# Patient Record
Sex: Female | Born: 1995 | Race: White | Hispanic: No | Marital: Single | Smoking: Never smoker
Health system: Southern US, Community
[De-identification: ages and names within clinical notes are randomized; demographics above are authoritative.]

## PROBLEM LIST (undated history)

## (undated) DIAGNOSIS — F32A Depression, unspecified: Secondary | ICD-10-CM

## (undated) DIAGNOSIS — Z860100 Personal history of colon polyps, unspecified: Secondary | ICD-10-CM

## (undated) DIAGNOSIS — Z8719 Personal history of other diseases of the digestive system: Secondary | ICD-10-CM

## (undated) DIAGNOSIS — F329 Major depressive disorder, single episode, unspecified: Secondary | ICD-10-CM

## (undated) DIAGNOSIS — Z8679 Personal history of other diseases of the circulatory system: Secondary | ICD-10-CM

## (undated) DIAGNOSIS — F419 Anxiety disorder, unspecified: Secondary | ICD-10-CM

## (undated) DIAGNOSIS — Z87440 Personal history of urinary (tract) infections: Secondary | ICD-10-CM

## (undated) HISTORY — DX: Personal history of urinary (tract) infections: Z87.440

## (undated) HISTORY — DX: Personal history of colon polyps, unspecified: Z86.0100

## (undated) HISTORY — DX: Personal history of other diseases of the circulatory system: Z86.79

## (undated) HISTORY — DX: Personal history of other diseases of the digestive system: Z87.19

## (undated) HISTORY — DX: Anxiety disorder, unspecified: F41.9

## (undated) HISTORY — DX: Depression, unspecified: F32.A

## (undated) HISTORY — DX: Major depressive disorder, single episode, unspecified: F32.9

---

## 2018-05-07 ENCOUNTER — Telehealth: Payer: Self-pay | Admitting: *Deleted

## 2018-05-07 NOTE — Telephone Encounter (Signed)
Copied from CRM [id number]. Topic: Quick Communication - Appointment Cancellation >> May 07, 2018  1:04 PM Mickel BaasMcGee, Demi B, [place] wrote: Patient called to cancel appointment scheduled for 05/08/18. Patient has not rescheduled their appointment.  Route to department's PEC pool.

## 2018-05-08 ENCOUNTER — Ambulatory Visit: Payer: Self-pay | Admitting: Internal Medicine

## 2018-08-01 ENCOUNTER — Encounter: Payer: Self-pay | Admitting: Family Medicine

## 2018-08-01 ENCOUNTER — Ambulatory Visit (INDEPENDENT_AMBULATORY_CARE_PROVIDER_SITE_OTHER): Payer: BLUE CROSS/BLUE SHIELD | Admitting: Family Medicine

## 2018-08-01 VITALS — BP 138/98 | HR 76 | Temp 98.6°F | Ht 61.5 in | Wt 197.8 lb

## 2018-08-01 DIAGNOSIS — E6609 Other obesity due to excess calories: Secondary | ICD-10-CM | POA: Diagnosis not present

## 2018-08-01 DIAGNOSIS — F419 Anxiety disorder, unspecified: Secondary | ICD-10-CM | POA: Diagnosis not present

## 2018-08-01 DIAGNOSIS — R030 Elevated blood-pressure reading, without diagnosis of hypertension: Secondary | ICD-10-CM | POA: Diagnosis not present

## 2018-08-01 DIAGNOSIS — F329 Major depressive disorder, single episode, unspecified: Secondary | ICD-10-CM | POA: Diagnosis not present

## 2018-08-01 DIAGNOSIS — Z6836 Body mass index (BMI) 36.0-36.9, adult: Secondary | ICD-10-CM

## 2018-08-01 DIAGNOSIS — F32A Depression, unspecified: Secondary | ICD-10-CM

## 2018-08-01 MED ORDER — HYDROXYZINE HCL 10 MG PO TABS: 10.0000 mg | ORAL_TABLET | Freq: Three times a day (TID) | ORAL | 1 refills | Status: DC | PRN

## 2018-08-01 MED ORDER — ESCITALOPRAM OXALATE 10 MG PO TABS: 10.0000 mg | ORAL_TABLET | Freq: Every day | ORAL | 2 refills | Status: DC

## 2018-08-01 NOTE — Patient Instructions (Signed)
This is  Dr. Tullo's  example of a  "Low GI"  Diet:  It will allow you to lose 4 to 8  lbs  per month if you follow it carefully.  Your goal with exercise is a minimum of 30 minutes of aerobic exercise 5 days per week (Walking does not count once it becomes easy!)    All of the foods can be found at grocery stores and in bulk at [employer].  The Atkins protein bars and shakes are available in more varieties at Target, Wal[employer] and [employer]'s [employer].     7 AM Breakfast:  Choose from the following:  Low carbohydrate Protein  Shakes (I recommend the  Premier Protein chocolate shakes,  EAS AdvantEdge "Carb Control" shakes  Or the Atkins shakes all are under 3 net carbs)     a scrambled egg/bacon/cheese burrito made with Mission's "carb balance" whole wheat tortilla  (about 10 net carbs )  [employer] sells microwaveable frittata (basically a quiche without the pastry crust) that is eaten cold and very convenient way to get your eggs.  8 carbs)  If you make your own protein shakes, avoid bananas and pineapple,  And use low carb greek yogurt or original /unsweetened almond or soy milk    Avoid cereal and bananas, oatmeal and cream of wheat and grits. They are loaded with carbohydrates!   10 AM: high protein snack:  Protein bar by Atkins (the snack size, under 200 cal, usually < 6 net carbs).    A stick of cheese:  Around 1 carb,  100 cal     Dannon Light n Fit Greek Yogurt  (80 cal, 8 carbs)  Other so called "protein bars" and Greek yogurts tend to be loaded with carbohydrates.  Remember, in food advertising, the word "energy" is synonymous for " carbohydrate."  Lunch:   A Sandwich using the bread choices listed, Can use any  Eggs,  lunchmeat, grilled meat or canned tuna), avocado, regular mayo/mustard  and cheese.  A Salad using blue cheese, ranch,  Goddess or vinagrette,  Avoid taco shells, croutons or "confetti" and no "candied nuts" but regular nuts OK.   No pretzels, nabs  or chips.  Pickles and  miniature sweet peppers are a good low carb alternative that provide a "crunch"  The bread is the only source of carbohydrate in a sandwich and  can be decreased by trying some of the attached alternatives to traditional loaf bread   Avoid "Low fat dressings, as well as [place] and [place] dressings They are loaded with sugar!   3 PM/ Mid day  Snack:  Consider  1 ounce of  almonds, walnuts, pistachios, pecans, peanuts,  Macadamia nuts or a nut medley.  Avoid "granola and granola bars "  Mixed nuts are ok in moderation as long as there are no raisins,  cranberries or dried fruit.   KIND bars are OK if you get the low glycemic index variety   Try the prosciutto/mozzarella cheese sticks by Fiorruci  In deli /backery section   High protein      6 PM  Dinner:     Meat/fowl/fish with a green salad, and either broccoli, cauliflower, green beans, spinach, brussel sprouts or  Lima beans. DO NOT BREAD THE PROTEIN!!      There is a low carb pasta by Dreamfield's that is acceptable and tastes great: only 5 digestible carbs/serving.( All grocery stores but BJs carry it ) Several ready made meals are   available low carb:   Try Michel Angelo's chicken piccata or chicken or eggplant parm over low carb pasta.(Lowes and BJs)   Aaron Sanchez's "Carnitas" (pulled pork, no sauce,  0 carbs) or his beef pot roast to make a dinner burrito (at [employer]J's)  Pesto over low carb pasta (bj's sells a good quality pesto in the center refrigerated section of the deli   Try satueeing  Bok Choy with mushroooms as a good side   Green Giant makes a mashed cauliflower that tastes like mashed potatoes  Whole wheat pasta is still full of digestible carbs and  Not as low in glycemic index as Dreamfield's.   Brown rice is still rice,  So skip the rice and noodles if you eat Chinese or Thai (or at least limit to 1/2 cup)  9 PM snack :   Breyer's "low carb" fudgsicle or  ice cream bar (Carb Smart line), or  Weight Watcher's ice  cream bar , or another "no sugar added" ice cream;  a serving of fresh berries/cherries with whipped cream   Cheese or DANNON'S LlGHT N FIT GREEK YOGURT  8 ounces of [place] unsweetened almond/cococunut milk    Treat yourself to a parfait made with whipped cream blueberiies, walnuts and vanilla greek yogurt  Avoid bananas, pineapple, grapes  and watermelon on a regular basis because they are high in sugar.  THINK OF THEM AS DESSERT  Remember that snack Substitutions should be less than 10 NET carbs per serving and meals < 20 carbs. Remember to subtract fiber grams to get the "net carbs."   

## 2018-08-01 NOTE — Progress Notes (Signed)
Subjective:    Patient ID: Leah Baxter, female    DOB: 1995-12-22, 22 y.o.   MRN: [id number]  HPI   Patient presents to clinic to establish primary care.  Her main concern today is anxiety and depression.  States her anxiety has seemed worse over the past few years, especially after the passing of her sister.  Patient states she will get anxious for no reason, especially when having to go out on group events and going to family gatherings.  Denies any suicidal homicidal ideation.  Does have times where she feels down and sad, but states that anxiety is the more pressing issue.  Patient also would like to discuss her weight.  Patient states she often turns to food as comfort due to feeling anxious or sad.  States she has never really tried any sort of diet and stuck to it, she will have periods where she will work on eating healthy and exercising for a few weeks, but gets discouraged that she is not seeing results quickly.  Patient states she has gone to weight loss clinic in the past and was started on phentermine, but felt super anxious on this medicine.  Past Medical History:  Diagnosis Date  . Anxiety   . Depression    Family History  Problem Relation Age of Onset  . Hyperlipidemia Mother   . Hypertension Mother    Social History   Tobacco Use  . Smoking status: Never Smoker  . Smokeless tobacco: Never Used  Substance Use Topics  . Alcohol use: Yes   History reviewed. No pertinent surgical history.  Review of Systems  Constitutional: Negative for chills, fatigue and fever.  HENT: Negative for congestion, ear pain, sinus pain and sore throat.   Eyes: Negative.   Respiratory: Negative for cough, shortness of breath and wheezing.   Cardiovascular: Negative for chest pain, palpitations and leg swelling.  Gastrointestinal: Negative for abdominal pain, diarrhea, nausea and vomiting.  Genitourinary: Negative for dysuria, frequency and urgency.  Musculoskeletal: Negative for  arthralgias and myalgias.  Skin: Negative for color change, pallor and rash.  Neurological: Negative for syncope, light-headedness and headaches.  Psychiatric/Behavioral: The patient is nervous/anxious.       Objective:   Physical Exam  Constitutional: She is oriented to person, place, and time. No distress.  HENT:  Head: Normocephalic and atraumatic.  Eyes: Conjunctivae and EOM are normal. No scleral icterus.  Neck: Neck supple. No tracheal deviation present.  Cardiovascular: Normal rate and regular rhythm.  Pulmonary/Chest: Effort normal and breath sounds normal.  Abdominal: Soft. Bowel sounds are normal. There is no tenderness.  Musculoskeletal: Normal range of motion. She exhibits no edema.  Neurological: She is alert and oriented to person, place, and time.  Skin: Skin is warm and dry. She is not diaphoretic. No pallor.  Psychiatric: She has a normal mood and affect. Her behavior is normal. Judgment and thought content normal.  States anxiety will come up out of nowhere at times and she will end up staying at home due to being too anxious to go out  Nursing note and vitals reviewed.    Body mass index is 36.77 kg/m.  Vitals:   08/01/18 1552  BP: (!) 138/98  Pulse: 76  Temp: 98.6 F (37 C)  SpO2: 96%   Assessment & Plan:   Anxiety and depression-patient will take Lexapro 10 mg once daily.  She will also use hydroxyzine as needed for any anxiety breakthrough.  Strategies to help control anxiety discussed  including deep breathing, going for walks.  Patient would prefer not to go to counseling at this time, states it does not work with her work schedule.  Obesity-patient given low-carb diet plan to work on following and also advised to download the app called lose it which is a great applicable on her smart phone that helps to count calories and keep track of all of your intake.  Patient encouraged to do regular physical activity at least 3-5 times per week.  Also advised that  weight loss is a long journey, it does not happen quickly.  Also discussed that we want her to change her diet and await that will be maintainable for the long-term rather than an extreme diet that is impossible to maintain.  Elevated BP reading-patient blood pressure elevated in clinic today.  We will monitor blood pressure readings at her next visit.  Follow-up in 3 to 4 weeks for recheck after starting new medication and also working on diet and exercise.  Advised she can return to clinic sooner if any issues arise.

## 2018-08-02 ENCOUNTER — Encounter: Payer: Self-pay | Admitting: Family Medicine

## 2018-08-02 DIAGNOSIS — Z6836 Body mass index (BMI) 36.0-36.9, adult: Secondary | ICD-10-CM

## 2018-08-02 DIAGNOSIS — F329 Major depressive disorder, single episode, unspecified: Secondary | ICD-10-CM | POA: Insufficient documentation

## 2018-08-02 DIAGNOSIS — E6609 Other obesity due to excess calories: Secondary | ICD-10-CM | POA: Insufficient documentation

## 2018-08-02 DIAGNOSIS — F419 Anxiety disorder, unspecified: Principal | ICD-10-CM

## 2018-08-02 DIAGNOSIS — F32A Depression, unspecified: Secondary | ICD-10-CM | POA: Insufficient documentation

## 2018-09-02 ENCOUNTER — Ambulatory Visit: Payer: BLUE CROSS/BLUE SHIELD | Admitting: Family Medicine

## 2018-11-21 ENCOUNTER — Ambulatory Visit (INDEPENDENT_AMBULATORY_CARE_PROVIDER_SITE_OTHER): Payer: Managed Care, Other (non HMO) | Admitting: Internal Medicine

## 2018-11-21 ENCOUNTER — Encounter: Payer: Self-pay | Admitting: Internal Medicine

## 2018-11-21 VITALS — BP 138/90 | HR 84 | Temp 98.5°F | Ht 61.5 in | Wt 200.2 lb

## 2018-11-21 DIAGNOSIS — M62838 Other muscle spasm: Secondary | ICD-10-CM

## 2018-11-21 DIAGNOSIS — M542 Cervicalgia: Secondary | ICD-10-CM

## 2018-11-21 MED ORDER — METHOCARBAMOL 500 MG PO TABS: 500.0000 mg | ORAL_TABLET | Freq: Two times a day (BID) | ORAL | 0 refills | Status: DC | PRN

## 2018-11-21 NOTE — Patient Instructions (Signed)
Look up neck exercises on [facility] or web MD  Try tylenol with ibuprofen as needed ibuprofen x 2 weeks or less  Heat   Cervical Sprain  A cervical sprain is a stretch or tear in one or more of the tough, cord-like tissues that connect bones (ligaments) in the neck. Cervical sprains can range from mild to severe. Severe cervical sprains can cause the spinal bones (vertebrae) in the neck to be unstable. This can lead to spinal cord damage and can result in serious nervous system problems. The amount of time that it takes for a cervical sprain to get better depends on the cause and extent of the injury. Most cervical sprains heal in 4-6 weeks. What are the causes? Cervical sprains may be caused by an injury (trauma), such as from a motor vehicle accident, a fall, or sudden forward and backward whipping movement of the head and neck (whiplash injury). Mild cervical sprains may be caused by wear and tear over time, such as from poor posture, sitting in a chair that does not provide support, or looking up or down for long periods of time. What increases the risk? The following factors may make you more likely to develop this condition:  Participating in activities that have a high risk of trauma to the neck. These include contact sports, auto racing, gymnastics, and diving.  Taking risks when driving or riding in a motor vehicle, such as speeding.  Having osteoarthritis of the spine.  Having poor strength and flexibility of the neck.  A previous neck injury.  Having poor posture.  Spending a lot of time in certain positions that put stress on the neck, such as sitting at a computer for long periods of time. What are the signs or symptoms? Symptoms of this condition include:  Pain, soreness, stiffness, tenderness, swelling, or a burning sensation in the front, back, or sides of the neck.  Sudden tightening of neck muscles that you cannot control (muscle spasms).  Pain in the shoulders  or upper back.  Limited ability to move the neck.  Headache.  Dizziness.  Nausea.  Vomiting.  Weakness, numbness, or tingling in a hand or an arm. Symptoms may develop right away after injury, or they may develop over a few days. In some cases, symptoms may go away with treatment and return (recur) over time. How is this diagnosed? This condition may be diagnosed based on:  Your medical history.  Your symptoms.  Any recent injuries or known neck problems that you have, such as arthritis in the neck.  A physical exam.  Imaging tests, such as: ? X-rays. ? MRI. ? CT scan. How is this treated? This condition is treated by resting and icing the injured area and doing physical therapy exercises. Depending on the severity of your condition, treatment may also include:  Keeping your neck in place (immobilized) for periods of time. This may be done using: ? A cervical collar. This supports your chin and the back of your head. ? A cervical traction device. This is a sling that holds up your head. This removes weight and pressure from your neck, and it may help to relieve pain.  Medicines that help to relieve pain and inflammation.  Medicines that help to relax your muscles (muscle relaxants).  Surgery. This is rare. Follow these instructions at home: If you have a cervical collar:   Wear it as told by your health care provider. Do not remove the collar unless instructed by your health  care provider.  Ask your health care provider before you make any adjustments to your collar.  If you have long hair, keep it outside of the collar.  Ask your health care provider if you can remove the collar for cleaning and bathing. If you are allowed to remove the collar for cleaning or bathing: ? Follow instructions from your health care provider about how to remove the collar safely. ? Clean the collar by wiping it with mild soap and water and drying it completely. ? If your collar has  removable pads, remove them every 1-2 days and wash them by hand with soap and water. Let them air-dry completely before you put them back in the collar. ? Check your skin under the collar for irritation or sores. If you see any, tell your health care provider. Managing pain, stiffness, and swelling   If directed, use a cervical traction device as told by your health care provider.  If directed, apply heat to the affected area before you do your physical therapy or as often as told by your health care provider. Use the heat source that your health care provider recommends, such as a moist heat pack or a heating pad. ? Place a towel between your skin and the heat source. ? Leave the heat on for 20-30 minutes. ? Remove the heat if your skin turns bright red. This is especially important if you are unable to feel pain, heat, or cold. You may have a greater risk of getting burned.  If directed, put ice on the affected area: ? Put ice in a plastic bag. ? Place a towel between your skin and the bag. ? Leave the ice on for 20 minutes, 2-3 times a day. Activity  Do not drive while wearing a cervical collar. If you do not have a cervical collar, ask your health care provider if it is safe to drive while your neck heals.  Do not drive or use heavy machinery while taking prescription pain medicine or muscle relaxants, unless your health care provider approves.  Do not lift anything that is heavier than 10 lb (4.5 kg) until your health care provider tells you that it is safe.  Rest as directed by your health care provider. Avoid positions and activities that make your symptoms worse. Ask your health care provider what activities are safe for you.  If physical therapy was prescribed, do exercises as told by your health care provider or physical therapist. General instructions  Take over-the-counter and prescription medicines only as told by your health care provider.  Do not use any products that  contain nicotine or tobacco, such as cigarettes and e-cigarettes. These can delay healing. If you need help quitting, ask your health care provider.  Keep all follow-up visits as told by your health care provider or physical therapist. This is important. How is this prevented? To prevent a cervical sprain from happening again:  Use and maintain good posture. Make any needed adjustments to your workstation to help you use good posture.  Exercise regularly as directed by your health care provider or physical therapist.  Avoid risky activities that may cause a cervical sprain. Contact a health care provider if:  You have symptoms that get worse or do not get better after 2 weeks of treatment.  You have pain that gets worse or does not get better with medicine.  You develop new, unexplained symptoms.  You have sores or irritated skin on your neck from wearing your cervical collar.  Get help right away if:  You have severe pain.  You develop numbness, tingling, or weakness in any part of your body.  You cannot move a part of your body (you have paralysis).  You have neck pain along with: ? Severe dizziness. ? Headache. Summary  A cervical sprain is a stretch or tear in one or more of the tough, cord-like tissues that connect bones (ligaments) in the neck.  Cervical sprains may be caused by an injury (trauma), such as from a motor vehicle accident, a fall, or sudden forward and backward whipping movement of the head and neck (whiplash injury).  Symptoms may develop right away after injury, or they may develop over a few days.  This condition is treated by resting and icing the injured area and doing physical therapy exercises. This information is not intended to replace advice given to you by your health care provider. Make sure you discuss any questions you have with your health care provider. Document Released: 07/16/2007 Document Revised: 05/17/2016 Document Reviewed:  05/17/2016 Elsevier Interactive Patient Education  2019 [employer].

## 2018-11-21 NOTE — Progress Notes (Signed)
Pre visit review using our clinic review tool, if applicable. No additional management support is needed unless otherwise documented below in the visit note. 

## 2018-11-21 NOTE — Progress Notes (Signed)
Chief Complaint  Patient presents with  . Follow-up   Acute visit neck pain since 2/18 She flipped her hair over her shoulder Tuesday am and had 10/10 neck pain x 1.5 days now neck pain 6/10 and trouble with ROM esp to the right. She did not sleep wrong nor denies neck trauma. She went to [facility] x2 times on Tuesday and has had another appt and 1 today total 4 office visits she is trying heating pad and stretching which helps some and Tylenol.    Review of Systems  Constitutional: Negative for weight loss.  HENT: Negative for hearing loss.   Eyes: Negative for blurred vision.  Respiratory: Negative for shortness of breath.   Cardiovascular: Negative for chest pain.  Gastrointestinal: Negative for abdominal pain.  Musculoskeletal: Positive for neck pain.  Skin: Negative for rash.  Neurological: Negative for headaches.  Psychiatric/Behavioral: Negative for depression.   Past Medical History:  Diagnosis Date  . Anxiety   . Depression    No past surgical history on file. Family History  Problem Relation Age of Onset  . Hyperlipidemia Mother   . Hypertension Mother    Social History   Socioeconomic History  . Marital status: Single    Spouse name: Not on file  . Number of children: Not on file  . Years of education: Not on file  . Highest education level: Not on file  Occupational History  . Not on file  Social Needs  . Financial resource strain: Not on file  . Food insecurity:    Worry: Not on file    Inability: Not on file  . Transportation needs:    Medical: Not on file    Non-medical: Not on file  Tobacco Use  . Smoking status: Never Smoker  . Smokeless tobacco: Never Used  Substance and Sexual Activity  . Alcohol use: Yes  . Drug use: Never  . Sexual activity: Not on file  Lifestyle  . Physical activity:    Days per week: Not on file    Minutes per session: Not on file  . Stress: Not on file  Relationships  . Social connections:    Talks on  phone: Not on file    Gets together: Not on file    Attends religious service: Not on file    Active member of club or organization: Not on file    Attends meetings of clubs or organizations: Not on file    Relationship status: Not on file  . Intimate partner violence:    Fear of current or ex partner: Not on file    Emotionally abused: Not on file    Physically abused: Not on file    Forced sexual activity: Not on file  Other Topics Concern  . Not on file  Social History Narrative  . Not on file   Current Meds  Medication Sig  . escitalopram (LEXAPRO) 10 MG tablet Take 1 tablet (10 mg total) by mouth daily.  . hydrOXYzine (ATARAX/VISTARIL) 10 MG tablet Take 1 tablet (10 mg total) by mouth 3 (three) times daily as needed for anxiety.   Allergies  Allergen Reactions  . Penicillins Hives   No results found for this or any previous visit (from the past 2160 hour(s)). Objective  Body mass index is 37.21 kg/m. Wt Readings from Last 3 Encounters:  11/21/18 200 lb 3.2 oz (90.8 kg)  08/01/18 197 lb 12.8 oz (89.7 kg)   Temp Readings from Last 3 Encounters:  11/21/18  98.5 F (36.9 C) (Oral)  08/01/18 98.6 F (37 C) (Oral)   BP Readings from Last 3 Encounters:  11/21/18 138/90  08/01/18 (!) 138/98   Pulse Readings from Last 3 Encounters:  11/21/18 84  08/01/18 76    Physical Exam Vitals signs and nursing note reviewed.  Constitutional:      Appearance: Normal appearance. She is well-developed and well-groomed.  HENT:     Head: Normocephalic and atraumatic.     Nose: Nose normal.     Mouth/Throat:     Mouth: Mucous membranes are moist.     Pharynx: Oropharynx is clear.  Eyes:     Conjunctiva/sclera: Conjunctivae normal.     Pupils: Pupils are equal, round, and reactive to light.  Cardiovascular:     Rate and Rhythm: Normal rate and regular rhythm.     Heart sounds: Normal heart sounds.  Pulmonary:     Effort: Pulmonary effort is normal.     Breath sounds: Normal  breath sounds.  Musculoskeletal:     Right shoulder: She exhibits spasm.     Cervical back: She exhibits decreased range of motion, tenderness and spasm.  Skin:    General: Skin is warm and dry.  Neurological:     General: No focal deficit present.     Mental Status: She is alert and oriented to person, place, and time. Mental status is at baseline.     Gait: Gait normal.  Psychiatric:        Attention and Perception: Attention and perception normal.        Mood and Affect: Mood and affect normal.        Speech: Speech normal.        Behavior: Behavior normal. Behavior is cooperative.        Thought Content: Thought content normal.        Cognition and Memory: Cognition and memory normal.        Judgment: Judgment normal.     Assessment   1. Cervicalgia and muscle spasm Plan   1. rec exercises f/u with chiropractor  Heat, tylenol, nsaids Aspercream, bengay or tiger balm  If not better f/u PCP in 1 month  Robaxin 500 mg bid try at night 1st only    Provider: Dr. French Ana McLean-Scocuzza-[facility]

## 2019-06-11 ENCOUNTER — Ambulatory Visit (INDEPENDENT_AMBULATORY_CARE_PROVIDER_SITE_OTHER): Payer: Managed Care, Other (non HMO) | Admitting: Family Medicine

## 2019-06-11 ENCOUNTER — Ambulatory Visit: Payer: Managed Care, Other (non HMO) | Admitting: Family Medicine

## 2019-06-11 ENCOUNTER — Encounter: Payer: Self-pay | Admitting: Family Medicine

## 2019-06-11 ENCOUNTER — Other Ambulatory Visit: Payer: Self-pay

## 2019-06-11 VITALS — BP 118/90 | HR 96 | Temp 97.5°F | Resp 16 | Ht 61.0 in | Wt 202.0 lb

## 2019-06-11 DIAGNOSIS — F419 Anxiety disorder, unspecified: Secondary | ICD-10-CM

## 2019-06-11 DIAGNOSIS — F32A Depression, unspecified: Secondary | ICD-10-CM

## 2019-06-11 DIAGNOSIS — F329 Major depressive disorder, single episode, unspecified: Secondary | ICD-10-CM

## 2019-06-11 DIAGNOSIS — Z713 Dietary counseling and surveillance: Secondary | ICD-10-CM | POA: Diagnosis not present

## 2019-06-11 DIAGNOSIS — G4700 Insomnia, unspecified: Secondary | ICD-10-CM | POA: Diagnosis not present

## 2019-06-11 DIAGNOSIS — Z6838 Body mass index (BMI) 38.0-38.9, adult: Secondary | ICD-10-CM | POA: Diagnosis not present

## 2019-06-11 MED ORDER — TRAZODONE HCL 50 MG PO TABS: 25.0000 mg | ORAL_TABLET | Freq: Every evening | ORAL | 2 refills | Status: DC | PRN

## 2019-06-11 MED ORDER — BUPROPION HCL ER (XL) 150 MG PO TB24: 150.0000 mg | ORAL_TABLET | Freq: Every day | ORAL | 2 refills | Status: DC

## 2019-06-11 NOTE — Patient Instructions (Signed)
This is  Dr. Tullo's  example of a  "Low GI"  Diet:  It will allow you to lose 4 to 8  lbs  per month if you follow it carefully.  Your goal with exercise is a minimum of 30 minutes of aerobic exercise 5 days per week (Walking does not count once it becomes easy!)    All of the foods can be found at grocery stores and in bulk at [employer].  The Atkins protein bars and shakes are available in more varieties at Target, Wal[employer] and [employer]'s [employer].     7 AM Breakfast:  Choose from the following:  Low carbohydrate Protein  Shakes (I recommend the  Premier Protein chocolate shakes,  EAS AdvantEdge "Carb Control" shakes  Or the Atkins shakes all are under 3 net carbs)     a scrambled egg/bacon/cheese burrito made with Mission's "carb balance" whole wheat tortilla  (about 10 net carbs )  [employer] sells microwaveable frittata (basically a quiche without the pastry crust) that is eaten cold and very convenient way to get your eggs.  8 carbs)  If you make your own protein shakes, avoid bananas and pineapple,  And use low carb greek yogurt or original /unsweetened almond or soy milk    Avoid cereal and bananas, oatmeal and cream of wheat and grits. They are loaded with carbohydrates!   10 AM: high protein snack:  Protein bar by Atkins (the snack size, under 200 cal, usually < 6 net carbs).    A stick of cheese:  Around 1 carb,  100 cal     Dannon Light n Fit Greek Yogurt  (80 cal, 8 carbs)  Other so called "protein bars" and Greek yogurts tend to be loaded with carbohydrates.  Remember, in food advertising, the word "energy" is synonymous for " carbohydrate."  Lunch:   A Sandwich using the bread choices listed, Can use any  Eggs,  lunchmeat, grilled meat or canned tuna), avocado, regular mayo/mustard  and cheese.  A Salad using blue cheese, ranch,  Goddess or vinagrette,  Avoid taco shells, croutons or "confetti" and no "candied nuts" but regular nuts OK.   No pretzels, nabs  or chips.  Pickles and  miniature sweet peppers are a good low carb alternative that provide a "crunch"  The bread is the only source of carbohydrate in a sandwich and  can be decreased by trying some of the attached alternatives to traditional loaf bread   Avoid "Low fat dressings, as well as [place] and [place] dressings They are loaded with sugar!   3 PM/ Mid day  Snack:  Consider  1 ounce of  almonds, walnuts, pistachios, pecans, peanuts,  Macadamia nuts or a nut medley.  Avoid "granola and granola bars "  Mixed nuts are ok in moderation as long as there are no raisins,  cranberries or dried fruit.   KIND bars are OK if you get the low glycemic index variety   Try the prosciutto/mozzarella cheese sticks by Fiorruci  In deli /backery section   High protein      6 PM  Dinner:     Meat/fowl/fish with a green salad, and either broccoli, cauliflower, green beans, spinach, brussel sprouts or  Lima beans. DO NOT BREAD THE PROTEIN!!      There is a low carb pasta by Dreamfield's that is acceptable and tastes great: only 5 digestible carbs/serving.( All grocery stores but BJs carry it ) Several ready made meals are   available low carb:   Try Michel Angelo's chicken piccata or chicken or eggplant parm over low carb pasta.(Lowes and BJs)   Aaron Sanchez's "Carnitas" (pulled pork, no sauce,  0 carbs) or his beef pot roast to make a dinner burrito (at [employer]J's)  Pesto over low carb pasta (bj's sells a good quality pesto in the center refrigerated section of the deli   Try satueeing  Bok Choy with mushroooms as a good side   Green Giant makes a mashed cauliflower that tastes like mashed potatoes  Whole wheat pasta is still full of digestible carbs and  Not as low in glycemic index as Dreamfield's.   Brown rice is still rice,  So skip the rice and noodles if you eat Chinese or Thai (or at least limit to 1/2 cup)  9 PM snack :   Breyer's "low carb" fudgsicle or  ice cream bar (Carb Smart line), or  Weight Watcher's ice  cream bar , or another "no sugar added" ice cream;  a serving of fresh berries/cherries with whipped cream   Cheese or DANNON'S LlGHT N FIT GREEK YOGURT  8 ounces of [place] unsweetened almond/cococunut milk    Treat yourself to a parfait made with whipped cream blueberiies, walnuts and vanilla greek yogurt  Avoid bananas, pineapple, grapes  and watermelon on a regular basis because they are high in sugar.  THINK OF THEM AS DESSERT  Remember that snack Substitutions should be less than 10 NET carbs per serving and meals < 20 carbs. Remember to subtract fiber grams to get the "net carbs."   

## 2019-06-11 NOTE — Progress Notes (Signed)
Subjective:    Patient ID: Leah Baxter, female    DOB: 02-02-1996, 23 y.o.   MRN: [id number]  HPI   Patient presents to clinic to discuss anxiety and depression and also weight loss.  She was ceasing Lexapro last year for type II months, but weaned herself off due to not liking how the medicine made her feel.  She is interested in trying a different medication for anxiety and depression.  States she has times of feeling overly stressed, anxious and nervous and possible times of feeling down depending on the situation.  Denies any SI or HI.  Does feel she needs a medicine to help her better control her mood.  Also struggles with insomnia, states there are times at night she cannot turn her mind off and this causes her having difficulty to get to sleep and stay asleep.  Also has started a low calorie low-carb and exercise program.  She is hopeful to jump start weight loss.  She has been on phentermine in the past with success in losing weight, but never was able to maintain that weight loss while on the phentermine.  Patient Active Problem List   Diagnosis Date Noted  . Class 2 obesity due to excess calories without serious comorbidity with body mass index (BMI) of 36.0 to 36.9 in adult 08/02/2018  . Anxiety and depression 08/02/2018   Social History   Tobacco Use  . Smoking status: Never Smoker  . Smokeless tobacco: Never Used  Substance Use Topics  . Alcohol use: Yes    Review of Systems  Constitutional: Negative for chills, fatigue and fever.  HENT: Negative for congestion, ear pain, sinus pain and sore throat.   Eyes: Negative.   Respiratory: Negative for cough, shortness of breath and wheezing.   Cardiovascular: Negative for chest pain, palpitations and leg swelling.  Gastrointestinal: Negative for abdominal pain, diarrhea, nausea and vomiting.  Genitourinary: Negative for dysuria, frequency and urgency.  Musculoskeletal: Negative for arthralgias and myalgias.  Skin:  Negative for color change, pallor and rash.  Neurological: Negative for syncope, light-headedness and headaches.  Psychiatric/Behavioral:   +anxiety, depression, insomnia  Objective:   Physical Exam Vitals signs and nursing note reviewed.  Constitutional:      General: She is not in acute distress.    Appearance: She is not ill-appearing, toxic-appearing or diaphoretic.  HENT:     Head: Normocephalic and atraumatic.  Eyes:     General: No scleral icterus.    Extraocular Movements: Extraocular movements intact.     Conjunctiva/sclera: Conjunctivae normal.     Pupils: Pupils are equal, round, and reactive to light.  Neck:     Musculoskeletal: Normal range of motion. No neck rigidity.     Thyroid: No thyromegaly or thyroid tenderness.  Cardiovascular:     Rate and Rhythm: Normal rate and regular rhythm.     Heart sounds: Normal heart sounds.  Pulmonary:     Effort: Pulmonary effort is normal. No respiratory distress.     Breath sounds: Normal breath sounds.  Skin:    General: Skin is warm and dry.     Coloration: Skin is not jaundiced or pale.  Neurological:     General: No focal deficit present.     Mental Status: She is alert and oriented to person, place, and time.     Gait: Gait normal.  Psychiatric:        Mood and Affect: Mood normal.        Behavior: Behavior  normal.     Comments: Able to clearly express thoughts. Good eye contact.     Today's Vitals   06/11/19 0925  BP: 118/90  Pulse: 96  Resp: 16  Temp: (!) 97.5 F (36.4 C)  TempSrc: Temporal  SpO2: 98%  Weight: 202 lb (91.6 kg)  Height: 5\' 1"  (1.549 m)   Body mass index is 38.17 kg/m.  Wt Readings from Last 3 Encounters:  06/11/19 202 lb (91.6 kg)  11/21/18 200 lb 3.2 oz (90.8 kg)  08/01/18 197 lb 12.8 oz (89.7 kg)      Assessment & Plan:    1. Anxiety and depression  Discussed different medication options.  A lot of the medications in the SSRI category can cause some weight gain.  Patient  agreeable to try Wellbutrin as this medication is less known to cause weight gain but can be good for mood control. Declines counseling referral at this time.   - buPROPion (WELLBUTRIN XL) 150 MG 24 hr tablet; Take 1 tablet (150 mg total) by mouth daily.  Dispense: 30 tablet; Refill: 2 - traZODone (DESYREL) 50 MG tablet; Take 0.5-1 tablets (25-50 mg total) by mouth at bedtime as needed for sleep.  Dispense: 30 tablet; Refill: 2  2. Insomnia, unspecified type  She will use trazodone PRN to help with sleep. Discussed good sleep hygiene -- wind down routine, not using TV or cell phone 20/30 min before bed, deep breathing.   - traZODone (DESYREL) 50 MG tablet; Take 0.5-1 tablets (25-50 mg total) by mouth at bedtime as needed for sleep.  Dispense: 30 tablet; Refill: 2  3. BMI 38.0-38.9,adult/ Weight loss counseling, encounter for  Discussed with patient that while phentermine can suppress appetite, since it cannot be used for the long-term it can be hard to maintain weight loss on the drug.  Recommended she try to be consistent with healthy diet and exercise program; consistency is key to maintaining weight loss.  Advised that weight loss is a long journey, it does not happen quickly.  Encouraged patient to the diet and exercise program she recently started.  Declines flu vaccine  Will follow up in 4 weeks for recheck

## 2019-07-16 ENCOUNTER — Other Ambulatory Visit: Payer: Self-pay

## 2019-07-16 ENCOUNTER — Ambulatory Visit (INDEPENDENT_AMBULATORY_CARE_PROVIDER_SITE_OTHER): Payer: Managed Care, Other (non HMO) | Admitting: Family Medicine

## 2019-07-16 ENCOUNTER — Encounter: Payer: Self-pay | Admitting: Family Medicine

## 2019-07-16 DIAGNOSIS — F419 Anxiety disorder, unspecified: Secondary | ICD-10-CM

## 2019-07-16 DIAGNOSIS — F329 Major depressive disorder, single episode, unspecified: Secondary | ICD-10-CM | POA: Diagnosis not present

## 2019-07-16 DIAGNOSIS — F32A Depression, unspecified: Secondary | ICD-10-CM

## 2019-07-16 MED ORDER — BUPROPION HCL ER (XL) 300 MG PO TB24: 300.0000 mg | ORAL_TABLET | Freq: Every day | ORAL | 2 refills | Status: DC

## 2019-07-16 NOTE — Progress Notes (Signed)
Patient ID: Leah Baxter, female   DOB: Oct 26, 1995, 23 y.o.   MRN: [id number]    Virtual Visit via video Note  This visit type was conducted due to national recommendations for restrictions regarding the COVID-19 pandemic (e.g. social distancing).  This format is felt to be most appropriate for this patient at this time.  All issues noted in this document were discussed and addressed.  No physical exam was performed (except for noted visual exam findings with Video Visits).   I connected with Ralph Leyden today at  8:00 AM EDT by a video enabled telemedicine application or telephone and verified that I am speaking with the correct person using two identifiers. Location patient: home Location provider: work or home office Persons participating in the virtual visit: patient, provider  I discussed the limitations, risks, security and privacy concerns of performing an evaluation and management service by video and the availability of in person appointments. I also discussed with the patient that there may be a patient responsible charge related to this service. The patient expressed understanding and agreed to proceed.  HPI:  Patient and I connected via video to follow-up on anxiety and depression after starting Wellbutrin and trazodone as needed.  Patient states since starting the Wellbutrin and overall does feel improved.  Does have times still feeling down and feels that when she feels down it is more extreme than it should be.  Denies any SI or HI.  Denies feeling any other negative effects from the new medications.  Has not used trazodone often, but when she did try it it did help her get some sleep.  She is interested in trying an increased dose of Wellbutrin to see how she responds.  Otherwise no fever or chills, no body aches, no chest pain, no shortness of breath or wheezing, no GI/GU complaints.  ROS: See pertinent positives and negatives per HPI.  Past Medical History:  Diagnosis  Date  . Anxiety   . Depression       Family History  Problem Relation Age of Onset  . Hyperlipidemia Mother   . Hypertension Mother    Social History   Tobacco Use  . Smoking status: Never Smoker  . Smokeless tobacco: Never Used  Substance Use Topics  . Alcohol use: Yes    Current Outpatient Medications:  .  buPROPion (WELLBUTRIN XL) 150 MG 24 hr tablet, Take 1 tablet (150 mg total) by mouth daily., Disp: 30 tablet, Rfl: 2 .  traZODone (DESYREL) 50 MG tablet, Take 0.5-1 tablets (25-50 mg total) by mouth at bedtime as needed for sleep., Disp: 30 tablet, Rfl: 2 .  hydrOXYzine (ATARAX/VISTARIL) 10 MG tablet, Take 1 tablet (10 mg total) by mouth 3 (three) times daily as needed for anxiety. (Patient not taking: Reported on 07/16/2019), Disp: 30 tablet, Rfl: 1 .  methocarbamol (ROBAXIN) 500 MG tablet, Take 1 tablet (500 mg total) by mouth 2 (two) times daily as needed for muscle spasms. (Patient not taking: Reported on 07/16/2019), Disp: 60 tablet, Rfl: 0  EXAM:  GENERAL: alert, oriented, appears well and in no acute distress  HEENT: atraumatic, conjunttiva clear, no obvious abnormalities on inspection of external nose and ears  NECK: normal movements of the head and neck  LUNGS: on inspection no signs of respiratory distress, breathing rate appears normal, no obvious gross SOB, gasping or wheezing  CV: no obvious cyanosis  MS: moves all visible extremities without noticeable abnormality  PSYCH/NEURO: pleasant and cooperative, no obvious depression or anxiety, speech  and thought processing grossly intact  ASSESSMENT AND PLAN:  Discussed the following assessment and plan:  No problem-specific Assessment & Plan notes found for this encounter.  1. Anxiety and depression We will increase Wellbutrin to 300 mg daily.  She will use trazodone as needed for.  Discussed anxiety reduction strategies & reassured patient that her mental health is a lifelong journey and I feel she is  doing very well and I am proud that she reached out for help when she was feeling down.  - buPROPion (WELLBUTRIN XL) 300 MG 24 hr tablet; Take 1 tablet (300 mg total) by mouth daily.  Dispense: 30 tablet; Refill: 2    I discussed the assessment and treatment plan with the patient. The patient was provided an opportunity to ask questions and all were answered. The patient agreed with the plan and demonstrated an understanding of the instructions.   The patient was advised to call back or seek an in-person evaluation if the symptoms worsen or if the condition fails to improve as anticipated.  Jodelle Green, FNP

## 2019-07-28 ENCOUNTER — Ambulatory Visit (INDEPENDENT_AMBULATORY_CARE_PROVIDER_SITE_OTHER): Payer: Managed Care, Other (non HMO) | Admitting: Family Medicine

## 2019-07-28 ENCOUNTER — Encounter: Payer: Self-pay | Admitting: Family Medicine

## 2019-07-28 ENCOUNTER — Other Ambulatory Visit: Payer: Self-pay

## 2019-07-28 ENCOUNTER — Telehealth: Payer: Self-pay

## 2019-07-28 VITALS — BP 118/86 | HR 102 | Temp 97.3°F | Wt 202.2 lb

## 2019-07-28 DIAGNOSIS — K5792 Diverticulitis of intestine, part unspecified, without perforation or abscess without bleeding: Secondary | ICD-10-CM | POA: Diagnosis not present

## 2019-07-28 DIAGNOSIS — R1032 Left lower quadrant pain: Secondary | ICD-10-CM

## 2019-07-28 MED ORDER — METRONIDAZOLE 500 MG PO TABS: 500.0000 mg | ORAL_TABLET | Freq: Two times a day (BID) | ORAL | 0 refills | Status: DC

## 2019-07-28 MED ORDER — CIPROFLOXACIN HCL 500 MG PO TABS: 500.0000 mg | ORAL_TABLET | Freq: Two times a day (BID) | ORAL | 0 refills | Status: DC

## 2019-07-28 NOTE — Patient Instructions (Signed)
Do CLEAR LIQUIDS for next 24 hours, then slowly advance diet as tolerated   Bland Diet A bland diet consists of foods that are often soft and do not have a lot of fat, fiber, or extra seasonings. Foods without fat, fiber, or seasoning are easier for the body to digest. They are also less likely to irritate your mouth, throat, stomach, and other parts of your digestive system. A bland diet is sometimes called a BRAT diet. What is my plan? Your health care provider or food and nutrition specialist (dietitian) may recommend specific changes to your diet to prevent symptoms or to treat your symptoms. These changes may include:  Eating small meals often.  Cooking food until it is soft enough to chew easily.  Chewing your food well.  Drinking fluids slowly.  Not eating foods that are very spicy, sour, or fatty.  Not eating citrus fruits, such as oranges and grapefruit. What do I need to know about this diet?  Eat a variety of foods from the bland diet food list.  Do not follow a bland diet longer than needed.  Ask your health care provider whether you should take vitamins or supplements. What foods can I eat? Grains  Hot cereals, such as cream of wheat. Rice. Bread, crackers, or tortillas made from refined white flour. Vegetables Canned or cooked vegetables. Mashed or boiled potatoes. Fruits  Bananas. Applesauce. Other types of cooked or canned fruit with the skin and seeds removed, such as canned peaches or pears. Meats and other proteins  Scrambled eggs. Creamy peanut butter or other nut butters. Lean, well-cooked meats, such as chicken or fish. Tofu. Soups or broths. Dairy Low-fat dairy products, such as milk, cottage cheese, or yogurt. Beverages  Water. Herbal tea. Apple juice. Fats and oils Mild salad dressings. Canola or olive oil. Sweets and desserts Pudding. Custard. Fruit gelatin. Ice cream. The items listed above may not be a complete list of recommended foods and  beverages. Contact a dietitian for more options. What foods are not recommended? Grains Whole grain breads and cereals. Vegetables Raw vegetables. Fruits Raw fruits, especially citrus, berries, or dried fruits. Dairy Whole fat dairy foods. Beverages Caffeinated drinks. Alcohol. Seasonings and condiments Strongly flavored seasonings or condiments. Hot sauce. Salsa. Other foods Spicy foods. Fried foods. Sour foods, such as pickled or fermented foods. Foods with high sugar content. Foods high in fiber. The items listed above may not be a complete list of foods and beverages to avoid. Contact a dietitian for more information. Summary  A bland diet consists of foods that are often soft and do not have a lot of fat, fiber, or extra seasonings.  Foods without fat, fiber, or seasoning are easier for the body to digest.  Check with your health care provider to see how long you should follow this diet plan. It is not meant to be followed for long periods. This information is not intended to replace advice given to you by your health care provider. Make sure you discuss any questions you have with your health care provider. Document Released: 01/10/2016 Document Revised: 10/17/2017 Document Reviewed: 10/17/2017 Elsevier Patient Education  2020 [employer].

## 2019-07-28 NOTE — Telephone Encounter (Signed)
Called Pt back and scheduled her a OV for 1:40pm today for Abdominal Pain

## 2019-07-28 NOTE — Telephone Encounter (Signed)
Copied from [place] [id number]. Topic: General - Other >> Jul 28, 2019  8:38 AM Keene Breath wrote: Reason for CRM: Patient to ask the nurse to call her regarding scheduling an appt. For abdominal pain.  She is not sure if she should come in or not.  Please advise and call to discuss at [phone]

## 2019-07-28 NOTE — Telephone Encounter (Signed)
She is requesting appt  Can be virtual or in person if passes covid screen

## 2019-07-28 NOTE — Telephone Encounter (Signed)
Pt called [facility] Reason for CRM: Patient to ask the nurse to call her regarding scheduling an appt. For abdominal pain.  She is not sure if she should come in or not.  Please advise and call to discuss at [phone]

## 2019-07-28 NOTE — Progress Notes (Signed)
Subjective:    Patient ID: Leah Baxter, female    DOB: September 19, 1996, 23 y.o.   MRN: [id number]  HPI   Patient presents to clinic due to left lower quadrant abdominal pain with some nausea and vomiting.  States pain is been present for about 2 or 3 days.  Only vomited x1.  Denies fever or chills.  Denies diarrhea.  Denies severe sharp stabbing pain.  States is more of a dull ache on the left side with twinges at times.  Does report family history of diverticulitis, wondering if that is what is happening to her.  Did not feel like eating much yesterday, but did keeo self hydrated with Gatorade and water  Patient Active Problem List   Diagnosis Date Noted  . Class 2 obesity due to excess calories without serious comorbidity with body mass index (BMI) of 36.0 to 36.9 in adult 08/02/2018  . Anxiety and depression 08/02/2018   Social History   Tobacco Use  . Smoking status: Never Smoker  . Smokeless tobacco: Never Used  Substance Use Topics  . Alcohol use: Yes   Review of Systems  Constitutional: Negative for chills, fatigue and fever.  HENT: Negative for congestion, ear pain, sinus pain and sore throat.   Eyes: Negative.   Respiratory: Negative for cough, shortness of breath and wheezing.   Cardiovascular: Negative for chest pain, palpitations and leg swelling.  Gastrointestinal: +nausea, some vomiting and LLQ abd pain Genitourinary: Negative for dysuria, frequency and urgency.  Musculoskeletal: Negative for arthralgias and myalgias.  Skin: Negative for color change, pallor and rash.  Neurological: Negative for syncope, light-headedness and headaches.  Psychiatric/Behavioral: The patient is not nervous/anxious.       Objective:   Physical Exam Vitals signs and nursing note reviewed.  Constitutional:      General: She is not in acute distress.    Appearance: She is not ill-appearing, toxic-appearing or diaphoretic.  HENT:     Head: Normocephalic and atraumatic.  Eyes:      General: No scleral icterus.    Extraocular Movements: Extraocular movements intact.     Pupils: Pupils are equal, round, and reactive to light.  Cardiovascular:     Rate and Rhythm: Normal rate and regular rhythm.  Pulmonary:     Effort: Pulmonary effort is normal.     Breath sounds: Normal breath sounds.  Abdominal:     General: Bowel sounds are normal.     Tenderness: There is abdominal tenderness in the left lower quadrant. There is no right CVA tenderness, left CVA tenderness, guarding or rebound.  Skin:    General: Skin is warm and dry.     Coloration: Skin is not cyanotic or jaundiced.  Neurological:     General: No focal deficit present.     Mental Status: She is alert and oriented to person, place, and time.    Today's Vitals   07/28/19 1351  BP: 118/86  Pulse: (!) 102  Temp: (!) 97.3 F (36.3 C)  SpO2: 98%  Weight: 202 lb 3.2 oz (91.7 kg)   Body mass index is 38.21 kg/m.    Assessment & Plan:    Left lower quadrant pain, diverticulitis suspected - patient will take Cipro Flagyl course to treat suspected diverticulitis.  She will follow a clear liquid diet for next 24 hours and then slowly advance to bland diet and increase diet as tolerated.  Discussed possibly getting a CT scan, patient would like to hold off on imaging at this  time agreeable to imaging if symptoms worsen rather than improve.  Patient will call us if she is not feeling better next severe emergency symptoms develop to go to emergency room right away.

## 2019-08-01 ENCOUNTER — Telehealth: Payer: Self-pay | Admitting: *Deleted

## 2019-08-01 NOTE — Telephone Encounter (Signed)
Patient called in stating that she is upset and panicking as she did research on the medications she is on and feels like the wrong medication was given to her. She stated she wants the PCP to call her back ASAP to fix this issue because she feels like this is a huge mistake. Please advised.

## 2019-08-01 NOTE — Telephone Encounter (Signed)
Called and spoke to patient.  Patient was given recommendation per Ander Purpura Guse's notes.  Patient is agreeable to stop Flagyl and finish the Cipro.  Patient was encouraged to eat yogurt daily.  Patient will call office back if this doesn't help resolve her upset stomach.

## 2019-08-01 NOTE — Telephone Encounter (Signed)
Copied from [place] [id number]. Topic: General - Inquiry >> Aug 01, 2019  9:11 AM Richardo Priest, NT wrote: Reason for CRM: Patient called in stating she is wondering if she can have an alternative to metroNIDAZOLE (FLAGYL) 500 MG tablet and ciprofloxacin (CIPRO) 500 MG tablet. Patient stated they are leaving her very nauseous and with an upset stomach. Other than that they are working very well. Please advise.

## 2019-08-01 NOTE — Telephone Encounter (Signed)
Almost all antibiotics can be very tough on the stomach.  Cipro and Flagyl are the primary treatment for diverticulitis.  If she cannot tolerate them both together.  I would recommend stopping the Flagyl and just finishing the Cipro.  Encourage patient to eat yogurt daily with antibiotics to help offset the stomach upset that can be associated with any antibiotic.

## 2019-08-01 NOTE — Telephone Encounter (Signed)
Patient called back and stated she looked further into it and would like office to disregard her last message. Please advise. Pt will continue with treatment plan.

## 2019-09-02 ENCOUNTER — Other Ambulatory Visit: Payer: Self-pay

## 2019-09-02 DIAGNOSIS — Z20822 Contact with and (suspected) exposure to covid-19: Secondary | ICD-10-CM

## 2019-09-04 LAB — NOVEL CORONAVIRUS, NAA: SARS-CoV-2, NAA: NOT DETECTED

## 2019-10-13 ENCOUNTER — Ambulatory Visit: Payer: Managed Care, Other (non HMO) | Attending: Internal Medicine

## 2019-10-13 DIAGNOSIS — Z20822 Contact with and (suspected) exposure to covid-19: Secondary | ICD-10-CM

## 2019-10-14 LAB — NOVEL CORONAVIRUS, NAA: SARS-CoV-2, NAA: NOT DETECTED

## 2019-10-20 ENCOUNTER — Other Ambulatory Visit: Payer: Self-pay

## 2019-10-20 ENCOUNTER — Ambulatory Visit: Payer: Managed Care, Other (non HMO) | Attending: Internal Medicine

## 2019-10-20 DIAGNOSIS — Z20822 Contact with and (suspected) exposure to covid-19: Secondary | ICD-10-CM

## 2019-10-21 LAB — NOVEL CORONAVIRUS, NAA: SARS-CoV-2, NAA: DETECTED — AB

## 2019-10-30 ENCOUNTER — Ambulatory Visit: Payer: Managed Care, Other (non HMO) | Attending: Internal Medicine

## 2019-10-30 DIAGNOSIS — Z20822 Contact with and (suspected) exposure to covid-19: Secondary | ICD-10-CM

## 2019-10-31 LAB — NOVEL CORONAVIRUS, NAA: SARS-CoV-2, NAA: NOT DETECTED

## 2019-11-13 ENCOUNTER — Other Ambulatory Visit: Payer: Self-pay | Admitting: Family Medicine

## 2019-11-13 DIAGNOSIS — F32A Depression, unspecified: Secondary | ICD-10-CM

## 2019-11-13 DIAGNOSIS — F329 Major depressive disorder, single episode, unspecified: Secondary | ICD-10-CM

## 2019-11-13 DIAGNOSIS — F419 Anxiety disorder, unspecified: Secondary | ICD-10-CM

## 2019-11-13 MED ORDER — BUPROPION HCL ER (XL) 300 MG PO TB24: 300.0000 mg | ORAL_TABLET | Freq: Every day | ORAL | 0 refills | Status: DC

## 2019-11-13 NOTE — Telephone Encounter (Signed)
Pt is requesting a prescription refill for Wellbutrin. She only has 1 pill left and needs it called into [facility] pharmacy. Pt scheduled a TOC appointment with Amedeo Kinsman on 11/27/19.

## 2019-11-13 NOTE — Telephone Encounter (Signed)
Sent to pharmacy 

## 2019-11-13 NOTE — Telephone Encounter (Signed)
OK to fill until [facility] appt with new NP?

## 2019-11-27 ENCOUNTER — Encounter: Payer: Managed Care, Other (non HMO) | Admitting: Nurse Practitioner

## 2019-12-02 ENCOUNTER — Encounter: Payer: Self-pay | Admitting: Nurse Practitioner

## 2019-12-02 ENCOUNTER — Ambulatory Visit: Payer: Managed Care, Other (non HMO) | Admitting: Nurse Practitioner

## 2019-12-02 ENCOUNTER — Other Ambulatory Visit: Payer: Self-pay

## 2019-12-02 VITALS — Ht 61.0 in | Wt 200.0 lb

## 2019-12-02 DIAGNOSIS — F329 Major depressive disorder, single episode, unspecified: Secondary | ICD-10-CM | POA: Diagnosis not present

## 2019-12-02 DIAGNOSIS — F419 Anxiety disorder, unspecified: Secondary | ICD-10-CM | POA: Diagnosis not present

## 2019-12-02 DIAGNOSIS — F32A Depression, unspecified: Secondary | ICD-10-CM

## 2019-12-02 NOTE — Progress Notes (Deleted)
Subjective:    Patient ID: Leah Baxter, female    DOB: May 17, 1996, 24 y.o.   MRN: [id number]  HPI   Past Medical History:  Diagnosis Date  . Anxiety   . Depression      Social History   Socioeconomic History  . Marital status: Single    Spouse name: Not on file  . Number of children: Not on file  . Years of education: Not on file  . Highest education level: Not on file  Occupational History  . Not on file  Tobacco Use  . Smoking status: Never Smoker  . Smokeless tobacco: Never Used  Substance and Sexual Activity  . Alcohol use: Yes  . Drug use: Never  . Sexual activity: Not on file  Other Topics Concern  . Not on file  Social History Narrative  . Not on file   Social Determinants of Health   Financial Resource Strain:   . Difficulty of Paying Living Expenses: Not on file  Food Insecurity:   . Worried About Charity fundraiser in the Last Year: Not on file  . Ran Out of Food in the Last Year: Not on file  Transportation Needs:   . Lack of Transportation (Medical): Not on file  . Lack of Transportation (Non-Medical): Not on file  Physical Activity:   . Days of Exercise per Week: Not on file  . Minutes of Exercise per Session: Not on file  Stress:   . Feeling of Stress : Not on file  Social Connections:   . Frequency of Communication with Friends and Family: Not on file  . Frequency of Social Gatherings with Friends and Family: Not on file  . Attends Religious Services: Not on file  . Active Member of Clubs or Organizations: Not on file  . Attends Archivist Meetings: Not on file  . Marital Status: Not on file  Intimate Partner Violence:   . Fear of Current or Ex-Partner: Not on file  . Emotionally Abused: Not on file  . Physically Abused: Not on file  . Sexually Abused: Not on file    No past surgical history on file.  Family History  Problem Relation Age of Onset  . Hyperlipidemia Mother   . Hypertension Mother     Allergies    Allergen Reactions  . Penicillins Hives    Current Outpatient Medications on File Prior to Visit  Medication Sig Dispense Refill  . buPROPion (WELLBUTRIN XL) 300 MG 24 hr tablet Take 1 tablet (300 mg total) by mouth daily. 30 tablet 0   No current facility-administered medications on file prior to visit.    Ht 5\' 1"  (1.549 m)   Wt 200 lb (90.7 kg)   BMI 37.79 kg/m       Objective:   Physical Exam Constitutional:      Appearance: Normal appearance.  Pulmonary:     Effort: Pulmonary effort is normal.  Neurological:     Mental Status: She is alert and oriented to person, place, and time.  Psychiatric:        Mood and Affect: Mood normal.     Comments: Controlled depression/anxiety and no SI.        Assessment & Plan:  Anxiety and depression well treated with Wellbutrin 300 mg daily.  Patient finds that this is the best medication that she has tried so far because it does not make her feel like a zombie.  She is sleeping well.  She is cheerful  appearing, feels well, and reports no side effects.  She declines referral for behavioral medicine.  She does not think she needs that at this time.  Obesity: Patient has adopted a healthy diet with fruits vegetables whole grains, and plans to get back to the gym.  Plan to return in a month for a complete physical with labs and a pap.  No new concerns or problems.  She will look up her immunizations as well for our records.  Amedeo Kinsman, NP

## 2019-12-02 NOTE — Patient Instructions (Signed)
It was great to meet you today.   Continue with Wellbutrin  Please make an appointment for complete physical and bring in your immunization record- if you can.   Continue with your plans  healthy diet and exercise.

## 2019-12-02 NOTE — Progress Notes (Signed)
I connected with Leah Baxter on 3/2/2021by telephone and verified that I am speaking with the correct person using two identifiers.   Location:  Patient: Work Provider: In Office    I discussed the limitations, risks, security and privacy concerns of performing an evaluation and management service by telephone and the availability of in person appointments. I also discussed with the patient that there may be a patient responsible charge related to this service. The patient expressed understanding and agreed to proceed.    History of Present Illness:   This 24 year old patient comes in for follow-up of anxiety/depression.  She reports that she is doing very well on Wellbutrin 300 mg daily.  Her initial anxiety symptoms have resolved.  She has noted no side effects from the medication.  She denies any problems with weight gain and is trying to lose weight with a healthy diet, and will be starting at the gym.  She would like to set up a office visit in a month to get a CPE with a pap test.  She denies any health concerns.   Observations/Objective:  GEN:  Well appearing and conversive Color: normal Resp: Breathing is non-labored and regular  Assessment and Plan:  Anxiety + depression well-controlled on current therapy as well Wellbutrin 300 mg daily.  She has noted relief of symptoms without side effects.   Obesity: Patient is motivated for weight loss through healthy diet and exercise.  She plans to get back to the gym.  She is in need of routine CPE with Pap and would like to schedule this in 1 month.  Follow Up Instructions:      I discussed the assessment and treatment plan with the patient. The patient was provided an opportunity to ask questions and all were answered. The patient agreed with the plan and demonstrated an understanding of the instructions.     The patient was advised to call back or seek an in-person evaluation if the symptoms worsen or if the condition fails to  improve as anticipated.   I provided 20 minutes of non-face-to-face time during this encounter.    Amedeo Kinsman, NP

## 2019-12-09 ENCOUNTER — Other Ambulatory Visit: Payer: Self-pay | Admitting: Family Medicine

## 2019-12-09 DIAGNOSIS — F32A Depression, unspecified: Secondary | ICD-10-CM

## 2019-12-09 DIAGNOSIS — F329 Major depressive disorder, single episode, unspecified: Secondary | ICD-10-CM

## 2019-12-09 DIAGNOSIS — F419 Anxiety disorder, unspecified: Secondary | ICD-10-CM

## 2020-01-01 ENCOUNTER — Ambulatory Visit: Payer: Managed Care, Other (non HMO) | Admitting: Nurse Practitioner

## 2020-01-04 ENCOUNTER — Other Ambulatory Visit: Payer: Self-pay | Admitting: Nurse Practitioner

## 2020-01-04 DIAGNOSIS — F419 Anxiety disorder, unspecified: Secondary | ICD-10-CM

## 2020-01-04 DIAGNOSIS — F32A Depression, unspecified: Secondary | ICD-10-CM

## 2020-01-04 DIAGNOSIS — F329 Major depressive disorder, single episode, unspecified: Secondary | ICD-10-CM

## 2020-02-05 ENCOUNTER — Ambulatory Visit (INDEPENDENT_AMBULATORY_CARE_PROVIDER_SITE_OTHER): Payer: Managed Care, Other (non HMO) | Admitting: Gastroenterology

## 2020-02-05 ENCOUNTER — Encounter: Payer: Self-pay | Admitting: Gastroenterology

## 2020-02-05 ENCOUNTER — Other Ambulatory Visit: Payer: Self-pay

## 2020-02-05 VITALS — BP 129/85 | HR 86 | Temp 97.1°F | Ht 61.0 in | Wt 207.0 lb

## 2020-02-05 DIAGNOSIS — R1032 Left lower quadrant pain: Secondary | ICD-10-CM | POA: Diagnosis not present

## 2020-02-05 NOTE — Progress Notes (Signed)
Gastroenterology Consultation  Referring Provider:     Kalman Drape, PA Primary Care Physician:  Marval Regal, NP Primary Gastroenterologist:  Dr. Allen Norris     Reason for Consultation:     Possible diverticulitis        HPI:   Leah Baxter is a 24 y.o. y/o female referred for consultation & management of possible diverticulitis by Dr. Jerelene Redden, Janalyn Harder, NP.  This patient reports that she has had left lower quadrant pain multiple times in the last few months and was treated with antibiotics.  She states that the pain is debilitating and always in the left lower quadrant although it sometimes starts in the suprapubic area.  The patient has gone 2 days without seeking medical attention and is so debilitated she states that she has to go to the urgent care clinic to get antibiotics.  The patient reports that her father and mother have diverticulosis and diverticulitis and that is why she thinks she may have it.  She denies any fevers when she is having the symptoms but does report chills.  There is no report of any unexplained weight loss black stools or bloody stools.  She also denies any dysphagia.  The patient has not had a CT scan while these episodes are going on to document diverticulitis.  The patient is also reporting that she has never had a colonoscopy in the past.  Past Medical History:  Diagnosis Date  . Anxiety   . Depression     History reviewed. No pertinent surgical history.  Prior to Admission medications   Medication Sig Start Date End Date Taking? Authorizing Provider  buPROPion (WELLBUTRIN XL) 300 MG 24 hr tablet TAKE 1 TABLET BY MOUTH EVERY DAY 01/05/20  Yes Marval Regal, NP    Family History  Problem Relation Age of Onset  . Hyperlipidemia Mother   . Hypertension Mother      Social History   Tobacco Use  . Smoking status: Never Smoker  . Smokeless tobacco: Never Used  Substance Use Topics  . Alcohol use: Yes  . Drug use: Never    Allergies as  of 02/05/2020 - Review Complete 02/05/2020  Allergen Reaction Noted  . Penicillins Hives 08/01/2018    Review of Systems:    All systems reviewed and negative except where noted in HPI.   Physical Exam:  BP 129/85   Pulse 86   Temp (!) 97.1 F (36.2 C) (Temporal)   Ht 5\' 1"  (1.549 m)   Wt 207 lb (93.9 kg)   BMI 39.11 kg/m  No LMP recorded. General:   Alert,  Well-developed, well-nourished, pleasant and cooperative in NAD Head:  Normocephalic and atraumatic. Eyes:  Sclera clear, no icterus.   Conjunctiva pink. Ears:  Normal auditory acuity. Neck:  Supple; no masses or thyromegaly. Lungs:  Respirations even and unlabored.  Clear throughout to auscultation.   No wheezes, crackles, or rhonchi. No acute distress. Heart:  Regular rate and rhythm; no murmurs, clicks, rubs, or gallops. Abdomen:  Normal bowel sounds.  No bruits.  Soft, non-tender and non-distended without masses, hepatosplenomegaly or hernias noted.  No guarding or rebound tenderness.  Negative Carnett sign.   Rectal:  Deferred.  Pulses:  Normal pulses noted. Extremities:  No clubbing or edema.  No cyanosis. Neurologic:  Alert and oriented x3;  grossly normal neurologically. Skin:  Intact without significant lesions or rashes.  No jaundice. Lymph Nodes:  No significant cervical adenopathy. Psych:  Alert and cooperative.  Normal mood and affect.  Imaging Studies: No results found.  Assessment and Plan:   Leah Baxter is a 24 y.o. y/o female who comes in today with a history of left lower quadrant pain with attacks that doubled her over in pain and last a few days until she gets antibiotics and then she states she feels better.  The patient thinks she may have diverticulitis since she has a strong family history of diverticulitis.  The patient is asymptomatic at the present time.  The patient has been told to contact my office if she should develop another attack of diverticulitis and at that time she will be started on  antibiotics and a CT scan will be ordered.  The patient has been explained the plan and agrees with it.    Midge Minium, MD. Clementeen Graham    Note: This dictation was prepared with Dragon dictation along with smaller phrase technology. Any transcriptional errors that result from this process are unintentional.

## 2020-04-10 ENCOUNTER — Inpatient Hospital Stay
Admission: EM | Admit: 2020-04-10 | Discharge: 2020-04-12 | DRG: 392 | Disposition: A | Discharge status: Home / Self Care | Payer: Managed Care, Other (non HMO) | Attending: Internal Medicine | Admitting: Internal Medicine

## 2020-04-10 ENCOUNTER — Emergency Department: Payer: Managed Care, Other (non HMO)

## 2020-04-10 ENCOUNTER — Other Ambulatory Visit: Payer: Self-pay

## 2020-04-10 DIAGNOSIS — F419 Anxiety disorder, unspecified: Secondary | ICD-10-CM | POA: Diagnosis not present

## 2020-04-10 DIAGNOSIS — E6609 Other obesity due to excess calories: Secondary | ICD-10-CM | POA: Diagnosis not present

## 2020-04-10 DIAGNOSIS — K5720 Diverticulitis of large intestine with perforation and abscess without bleeding: Principal | ICD-10-CM | POA: Diagnosis present

## 2020-04-10 DIAGNOSIS — F32A Depression, unspecified: Secondary | ICD-10-CM | POA: Diagnosis present

## 2020-04-10 DIAGNOSIS — Z6841 Body Mass Index (BMI) 40.0 and over, adult: Secondary | ICD-10-CM

## 2020-04-10 DIAGNOSIS — K5792 Diverticulitis of intestine, part unspecified, without perforation or abscess without bleeding: Secondary | ICD-10-CM | POA: Diagnosis not present

## 2020-04-10 DIAGNOSIS — K589 Irritable bowel syndrome without diarrhea: Secondary | ICD-10-CM | POA: Diagnosis present

## 2020-04-10 DIAGNOSIS — R109 Unspecified abdominal pain: Secondary | ICD-10-CM | POA: Diagnosis not present

## 2020-04-10 DIAGNOSIS — Z6836 Body mass index (BMI) 36.0-36.9, adult: Secondary | ICD-10-CM

## 2020-04-10 DIAGNOSIS — Z880 Allergy status to penicillin: Secondary | ICD-10-CM

## 2020-04-10 DIAGNOSIS — Z83438 Family history of other disorder of lipoprotein metabolism and other lipidemia: Secondary | ICD-10-CM

## 2020-04-10 DIAGNOSIS — F329 Major depressive disorder, single episode, unspecified: Secondary | ICD-10-CM | POA: Diagnosis present

## 2020-04-10 DIAGNOSIS — Z20822 Contact with and (suspected) exposure to covid-19: Secondary | ICD-10-CM | POA: Diagnosis present

## 2020-04-10 DIAGNOSIS — Z79899 Other long term (current) drug therapy: Secondary | ICD-10-CM

## 2020-04-10 DIAGNOSIS — Z8249 Family history of ischemic heart disease and other diseases of the circulatory system: Secondary | ICD-10-CM

## 2020-04-10 DIAGNOSIS — E66812 Obesity, class 2: Secondary | ICD-10-CM

## 2020-04-10 LAB — COMPREHENSIVE METABOLIC PANEL
ALT: 57 U/L — ABNORMAL HIGH (ref 0–44)
AST: 32 U/L (ref 15–41)
Albumin: 4.5 g/dL (ref 3.5–5.0)
Alkaline Phosphatase: 56 U/L (ref 38–126)
Anion gap: 8 (ref 5–15)
BUN: 12 mg/dL (ref 6–20)
CO2: 25 mmol/L (ref 22–32)
Calcium: 9.3 mg/dL (ref 8.9–10.3)
Chloride: 104 mmol/L (ref 98–111)
Creatinine, Ser: 0.91 mg/dL (ref 0.44–1.00)
GFR calc Af Amer: >60 mL/min (ref >60)
GFR calc non Af Amer: >60 mL/min (ref >60)
Glucose, Bld: 112 mg/dL — ABNORMAL HIGH (ref 70–99)
Potassium: 4.3 mmol/L (ref 3.5–5.1)
Sodium: 137 mmol/L (ref 135–145)
Total Bilirubin: 0.8 mg/dL (ref 0.3–1.2)
Total Protein: 7.7 g/dL (ref 6.5–8.1)

## 2020-04-10 LAB — CBC
HCT: 44.1 % (ref 36.0–46.0)
Hemoglobin: 15.1 g/dL — ABNORMAL HIGH (ref 12.0–15.0)
MCH: 31.7 pg (ref 26.0–34.0)
MCHC: 34.2 g/dL (ref 30.0–36.0)
MCV: 92.6 fL (ref 80.0–100.0)
Platelets: 221 10*3/uL (ref 150–400)
RBC: 4.76 MIL/uL (ref 3.87–5.11)
RDW: 12.7 % (ref 11.5–15.5)
WBC: 14.1 10*3/uL — ABNORMAL HIGH (ref 4.0–10.5)
nRBC: 0 % (ref 0.0–0.2)

## 2020-04-10 LAB — URINALYSIS, COMPLETE (UACMP) WITH MICROSCOPIC
Bilirubin Urine: NEGATIVE
Glucose, UA: NEGATIVE mg/dL
Hgb urine dipstick: NEGATIVE
Ketones, ur: NEGATIVE mg/dL
Nitrite: NEGATIVE
Protein, ur: 30 mg/dL — AB
Specific Gravity, Urine: 1.028 (ref 1.005–1.030)
pH: 6 (ref 5.0–8.0)

## 2020-04-10 LAB — SARS CORONAVIRUS 2 BY RT PCR (HOSPITAL ORDER, PERFORMED IN ~~LOC~~ HOSPITAL LAB): SARS Coronavirus 2: NEGATIVE

## 2020-04-10 LAB — POCT PREGNANCY, URINE: Preg Test, Ur: NEGATIVE

## 2020-04-10 LAB — FIBRIN DERIVATIVES D-DIMER (ARMC ONLY): Fibrin derivatives D-dimer (ARMC): 145.37 ng/mL (FEU) (ref 0.00–499.00)

## 2020-04-10 LAB — PREGNANCY, URINE: Preg Test, Ur: NEGATIVE

## 2020-04-10 MED ORDER — METRONIDAZOLE IN NACL 5-0.79 MG/ML-% IV SOLN
500.0000 mg | Freq: Once | INTRAVENOUS | Status: AC
  Administered 2020-04-10: 500 mg via INTRAVENOUS
  Filled 2020-04-10: qty 100

## 2020-04-10 MED ORDER — ONDANSETRON HCL 4 MG/2ML IJ SOLN
4.0000 mg | Freq: Four times a day (QID) | INTRAMUSCULAR | Status: DC | PRN
  Administered 2020-04-11: 4 mg via INTRAVENOUS
  Filled 2020-04-10: qty 2

## 2020-04-10 MED ORDER — IOHEXOL 300 MG/ML  SOLN
100.0000 mL | Freq: Once | INTRAMUSCULAR | Status: AC | PRN
  Administered 2020-04-10: 100 mL via INTRAVENOUS

## 2020-04-10 MED ORDER — ONDANSETRON HCL 4 MG PO TABS
4.0000 mg | ORAL_TABLET | Freq: Four times a day (QID) | ORAL | Status: DC | PRN
  Administered 2020-04-10: 4 mg via ORAL
  Filled 2020-04-10: qty 1

## 2020-04-10 MED ORDER — PANTOPRAZOLE SODIUM 40 MG IV SOLR
40.0000 mg | INTRAVENOUS | Status: DC
  Administered 2020-04-10 – 2020-04-11 (×2): 40 mg via INTRAVENOUS
  Filled 2020-04-10 (×2): qty 40

## 2020-04-10 MED ORDER — CIPROFLOXACIN IN D5W 400 MG/200ML IV SOLN
400.0000 mg | Freq: Two times a day (BID) | INTRAVENOUS | Status: DC
  Administered 2020-04-10 – 2020-04-12 (×4): 400 mg via INTRAVENOUS
  Filled 2020-04-10 (×6): qty 200

## 2020-04-10 MED ORDER — MORPHINE SULFATE (PF) 2 MG/ML IV SOLN
2.0000 mg | INTRAVENOUS | Status: DC | PRN
  Administered 2020-04-11: 2 mg via INTRAVENOUS
  Filled 2020-04-10 (×2): qty 1

## 2020-04-10 MED ORDER — CIPROFLOXACIN IN D5W 400 MG/200ML IV SOLN
400.0000 mg | Freq: Once | INTRAVENOUS | Status: AC
  Administered 2020-04-10: 400 mg via INTRAVENOUS
  Filled 2020-04-10: qty 200

## 2020-04-10 MED ORDER — IOHEXOL 9 MG/ML PO SOLN
500.0000 mL | Freq: Two times a day (BID) | ORAL | Status: DC | PRN
  Administered 2020-04-10: 500 mL via ORAL

## 2020-04-10 MED ORDER — ENOXAPARIN SODIUM 40 MG/0.4ML ~~LOC~~ SOLN
40.0000 mg | Freq: Two times a day (BID) | SUBCUTANEOUS | Status: DC
  Filled 2020-04-10 (×3): qty 0.4

## 2020-04-10 MED ORDER — BUPROPION HCL ER (XL) 150 MG PO TB24
300.0000 mg | ORAL_TABLET | Freq: Every day | ORAL | Status: DC
  Administered 2020-04-10 – 2020-04-12 (×3): 300 mg via ORAL
  Filled 2020-04-10 (×3): qty 2

## 2020-04-10 MED ORDER — METRONIDAZOLE IN NACL 5-0.79 MG/ML-% IV SOLN
500.0000 mg | Freq: Three times a day (TID) | INTRAVENOUS | Status: DC
  Administered 2020-04-10 – 2020-04-12 (×5): 500 mg via INTRAVENOUS
  Filled 2020-04-10 (×7): qty 100

## 2020-04-10 MED ORDER — SODIUM CHLORIDE 0.9 % IV SOLN: INTRAVENOUS | Status: DC

## 2020-04-10 NOTE — H&P (Addendum)
History and Physical    France Lusty IEP:[id number] DOB: [birth year] DOA: 04/10/2020  PCP: Theadore Nan, NP   Patient coming from: Home  I have personally briefly reviewed patient's old medical records in [facility]  Chief Complaint: Abdominal pain  HPI: Leah Baxter is a 24 y.o. female with medical history significant for inflammatory bowel syndrome, anxiety and depression who presents to the emergency room for evaluation of left lower quadrant/lumbar abdominal pain that started 1 day prior to admission after a meal.  She rates her pain an 8 x 10 in intensity at its worst and states that intensity has been increasing since its onset.  She describes it as a sharp and stabbing pain that is nonradiating and is associated with nausea but no vomiting.  Any form of movement worsens her pain.  Patient states that she has had multiple episodes of similar pain over the last 6 months which have resolved and not as intense at this time.  She was referred to GI as an outpatient and by the time she saw GI she was asymptomatic.  Patient states that over the last 24 to 48 hours she has eaten a lot of food with seeds in them which is the likely cause of this flareup. Labs reveal a white count of 14K. CT scan of abdomen and pelvis shows acute diverticulitis of the proximal descending colon with associated bowel wall thickening and pericolic inflammation.  Questionable tiny focus of extraluminal air immediately adjacent to this involved segment of the descending colon.  No free intraperitoneal air elsewhere in the abdomen or pelvis.  No abscess collection seen.   ED Course: Patient is a 24 year old Caucasian female who presents for evaluation of abdominal pain and is found to have acute diverticulitis with microperforation.  Patient received a dose of IV antibiotics in the ER and will be admitted to the hospital for further evaluation.  Review of Systems: As per HPI otherwise 10 point review of  systems negative.    Past Medical History:  Diagnosis Date  . Anxiety   . Depression     History reviewed. No pertinent surgical history.   reports that she has never smoked. She has never used smokeless tobacco. She reports current alcohol use. She reports that she does not use drugs.  Allergies  Allergen Reactions  . Penicillins Hives    Childhood allergy    Family History  Problem Relation Age of Onset  . Hyperlipidemia Mother   . Hypertension Mother      Prior to Admission medications   Medication Sig Start Date End Date Taking? Authorizing Provider  buPROPion (WELLBUTRIN XL) 300 MG 24 hr tablet TAKE 1 TABLET BY MOUTH EVERY DAY Patient taking differently: Take 300 mg by mouth daily.  01/05/20  Yes Theadore Nan, NP    Physical Exam: Vitals:   04/10/20 0904 04/10/20 0930 04/10/20 1157 04/10/20 1300  BP: (!) 131/100 (!) 128/97 129/88 113/80  Pulse: (!) 106 97 89 99  Resp: 20 19 18 17   Temp:      TempSrc:      SpO2: 100% 97% 99% 98%  Weight:      Height:         Vitals:   04/10/20 0904 04/10/20 0930 04/10/20 1157 04/10/20 1300  BP: (!) 131/100 (!) 128/97 129/88 113/80  Pulse: (!) 106 97 89 99  Resp: 20 19 18 17   Temp:      TempSrc:      SpO2: 100% 97%  99% 98%  Weight:      Height:        Constitutional: NAD, alert and oriented x 3 Eyes: PERRL, lids and conjunctivae normal ENMT: Mucous membranes are moist.  Neck: normal, supple, no masses, no thyromegaly Respiratory: clear to auscultation bilaterally, no wheezing, no crackles. Normal respiratory effort. No accessory muscle use.  Cardiovascular: Tachycardic, no murmurs / rubs / gallops. No extremity edema. 2+ pedal pulses. No carotid bruits.  Abdomen: tenderness LLQ, no masses palpated. No hepatosplenomegaly. Bowel sounds positive.  Musculoskeletal: no clubbing / cyanosis. No joint deformity upper and lower extremities.  Skin: no rashes, lesions, ulcers.  Neurologic: No gross focal neurologic  deficit. Psychiatric: Normal mood and affect.   Labs on Admission: I have personally reviewed following labs and imaging studies  CBC: Recent Labs  Lab 04/10/20 0135  WBC 14.1*  HGB 15.1*  HCT 44.1  MCV 92.6  PLT 221   Basic Metabolic Panel: Recent Labs  Lab 04/10/20 0210  NA 137  K 4.3  CL 104  CO2 25  GLUCOSE 112*  BUN 12  CREATININE 0.91  CALCIUM 9.3   GFR: Estimated Creatinine Clearance: 102.3 mL/min (by C-G formula based on SCr of 0.91 mg/dL). Liver Function Tests: Recent Labs  Lab 04/10/20 0210  AST 32  ALT 57*  ALKPHOS 56  BILITOT 0.8  PROT 7.7  ALBUMIN 4.5   No results for input(s): LIPASE, AMYLASE in the last 168 hours. No results for input(s): AMMONIA in the last 168 hours. Coagulation Profile: No results for input(s): INR, PROTIME in the last 168 hours. Cardiac Enzymes: No results for input(s): CKTOTAL, CKMB, CKMBINDEX, TROPONINI in the last 168 hours. BNP (last 3 results) No results for input(s): PROBNP in the last 8760 hours. HbA1C: No results for input(s): HGBA1C in the last 72 hours. CBG: No results for input(s): GLUCAP in the last 168 hours. Lipid Profile: No results for input(s): CHOL, HDL, LDLCALC, TRIG, CHOLHDL, LDLDIRECT in the last 72 hours. Thyroid Function Tests: No results for input(s): TSH, T4TOTAL, FREET4, T3FREE, THYROIDAB in the last 72 hours. Anemia Panel: No results for input(s): VITAMINB12, FOLATE, FERRITIN, TIBC, IRON, RETICCTPCT in the last 72 hours. Urine analysis:    Component Value Date/Time   COLORURINE YELLOW (A) 04/10/2020 0141   APPEARANCEUR CLOUDY (A) 04/10/2020 0141   LABSPEC 1.028 04/10/2020 0141   PHURINE 6.0 04/10/2020 0141   GLUCOSEU NEGATIVE 04/10/2020 0141   HGBUR NEGATIVE 04/10/2020 0141   BILIRUBINUR NEGATIVE 04/10/2020 0141   KETONESUR NEGATIVE 04/10/2020 0141   PROTEINUR 30 (A) 04/10/2020 0141   NITRITE NEGATIVE 04/10/2020 0141   LEUKOCYTESUR TRACE (A) 04/10/2020 0141    Radiological Exams  on Admission: CT ABDOMEN PELVIS W CONTRAST  Result Date: 04/10/2020 CLINICAL DATA:  LEFT lower quadrant abdominal pain and diarrhea. EXAM: CT ABDOMEN AND PELVIS WITH CONTRAST TECHNIQUE: Multidetector CT imaging of the abdomen and pelvis was performed using the standard protocol following bolus administration of intravenous contrast. CONTRAST:  OMNIPAQUE IOHEXOL 300 MG/ML  SOLN COMPARISON:  None. FINDINGS: Lower chest: Mild atelectasis at the LEFT lung base. Hepatobiliary: No focal liver abnormality is seen. No gallstones, gallbladder wall thickening, or biliary dilatation. Pancreas: Unremarkable. No pancreatic ductal dilatation or surrounding inflammatory changes. Spleen: Normal in size without focal abnormality. Adrenals/Urinary Tract: Adrenal glands appear normal. Kidneys are unremarkable without mass, stone or hydronephrosis. No ureteral or bladder calculi identified. Bladder appears normal, partially decompressed. Stomach/Bowel: No dilated large or small bowel loops. Focal segment of the proximal  descending colon with wall thickening and pericolic inflammation, consistent with acute diverticulitis. Questionable tiny focus of extraluminal air adjacent to this involved segment of the descending colon. Additional scattered diverticulosis within the transverse and sigmoid colon. Appendix is normal. Stomach is unremarkable, partially decompressed. Vascular/Lymphatic: No significant vascular findings are present. No enlarged abdominal or pelvic lymph nodes. Reproductive: Uterus and bilateral adnexa are unremarkable. Other: No abscess collection seen. No free intraperitoneal air away from the involved segment of the descending colon. Musculoskeletal: No acute or significant osseous findings. IMPRESSION: 1. Acute diverticulitis of the proximal descending colon, with associated bowel wall thickening and pericolic inflammation. Questionable tiny focus of extraluminal air immediately adjacent to this involved  segment of the descending colon. No free intraperitoneal air elsewhere in the abdomen or pelvis. No abscess collection seen. 2. Additional scattered diverticulosis within the transverse and sigmoid colon. Electronically Signed   By: Bary Richard M.D.   On: 04/10/2020 10:22    EKG: Independently reviewed.    Assessment/Plan Principal Problem:   Acute diverticulitis Active Problems:   Class 2 obesity due to excess calories without serious comorbidity with body mass index (BMI) of 36.0 to 36.9 in adult   Anxiety and depression    Acute diverticulitis Patient presents to the emergency room for evaluation of sudden onset left lower quadrant pain associated with nausea but no vomiting CT scan of the abdomen and pelvis confirms acute diverticulitis with microperforation We will place patient on IV Zosyn We will request surgical consult for findings of microperforation We will keep patient n.p.o. until seen by surgery Pain control We will request dietary consult to provide patient with a list of foods to avoid to prevent further flareup    Anxiety and depression Continue bupropion   Morbid obesity (BMI 41.57) Complicates overall prognosis and care   DVT prophylaxis: Lovenox Code Status: Full code Family Communication: Greater than 50% of time was spent discussing plan of care with patient at the bedside.  All questions and concerns have been addressed.  She verbalizes understanding and agrees with plan of care Disposition Plan: Back to previous home environment Consults called: Surgery, Dietary    Leldon Steege MD [facility]     04/10/2020, 1:14 PM

## 2020-04-10 NOTE — Progress Notes (Signed)
Anticoagulation monitoring(Lovenox):  23yo  female ordered Lovenox 40 mg Q24h  Filed Weights   04/10/20 0137  Weight: 99.8 kg (220 lb)   BMI 41.57   Lab Results  Component Value Date   CREATININE 0.91 04/10/2020   Estimated Creatinine Clearance: 102.3 mL/min (by C-G formula based on SCr of 0.91 mg/dL). Hemoglobin & Hematocrit     Component Value Date/Time   HGB 15.1 (H) 04/10/2020 0135   HCT 44.1 04/10/2020 0135     Per Protocol for Patient with estCrcl > 30 ml/min and BMI > 40, will transition to Lovenox 40 mg Q12h.

## 2020-04-10 NOTE — ED Notes (Signed)
Nurse Secretary has called for transport.

## 2020-04-10 NOTE — ED Notes (Signed)
RN has requested phlebotomy to come and straight stick patient for labs.

## 2020-04-10 NOTE — ED Notes (Signed)
Patient c/o LLQ abdominal pain and diarrhea. Patient reports hx of IBS and diarrhea. Denies urinary changes.

## 2020-04-10 NOTE — Consult Note (Signed)
Subjective:   CC: Diverticulitis, recurrent  HPI:  Leah Baxter is a 24 y.o. female who was consulted by [facility] for issue above.  Symptoms were first noted 2 days ago. Pain is sharp, localized to the left upper quadrant, nonradiating.  Believes it was instigated by increased seed intake.  Associated with nothing specific.  Alleviated by nothing specific.  No specific aggravating factors.  Patient had similar episodes of left lower quadrant pain and suprapubic pain before, totaling approximately 5 episodes.  She believes each episode was preceded by increased intake of seeds such as popcorn or tomatoes.  She has been to urgent care for these past episodes and was prescribed antibiotics for presumed diverticulitis and those episodes have resolved.  This is the first time the pain became unbearable and he was also recommended to proceed to the emergency department.  This is also the first time that she has been scanned and confirmed to have acute diverticulitis.  Currently she states the pain is tolerable.  Past Medical History:  has a past medical history of Anxiety and Depression.  Past Surgical History: History reviewed. No pertinent surgical history.  Family History: family history includes Hyperlipidemia in her mother; Hypertension in her mother.  Social History:  reports that she has never smoked. She has never used smokeless tobacco. She reports current alcohol use. She reports that she does not use drugs.  Current Medications: (Not in a hospital admission)   Allergies:  Allergies as of 04/10/2020 - Review Complete 04/10/2020  Allergen Reaction Noted  . Penicillins Hives 08/01/2018    ROS:  General: Denies weight loss, weight gain, fatigue, fevers, chills, and night sweats. Eyes: Denies blurry vision, double vision, eye pain, itchy eyes, and tearing. Ears: Denies hearing loss, earache, and ringing in ears. Nose: Denies sinus pain, congestion, infections, runny nose, and  nosebleeds. Mouth/throat: Denies hoarseness, sore throat, bleeding gums, and difficulty swallowing. Heart: Denies chest pain, palpitations, racing heart, irregular heartbeat, leg pain or swelling, and decreased activity tolerance. Respiratory: Denies breathing difficulty, shortness of breath, wheezing, cough, and sputum. GI: Denies change in appetite, heartburn, nausea, vomiting, constipation, diarrhea, and blood in stool. GU: Denies difficulty urinating, pain with urinating, urgency, frequency, blood in urine. Musculoskeletal: Denies joint stiffness, pain, swelling, muscle weakness. Skin: Denies rash, itching, mass, tumors, sores, and boils Neurologic: Denies headache, fainting, dizziness, seizures, numbness, and tingling. Psychiatric: Denies depression, anxiety, difficulty sleeping, and memory loss. Endocrine: Denies heat or cold intolerance, and increased thirst or urination. Blood/lymph: Denies easy bruising, easy bruising, and swollen glands     Objective:     BP 113/80 (BP Location: Right Arm)   Pulse 99   Temp 99.2 F (37.3 C) (Oral)   Resp 17   Ht 5\' 1"  (1.549 m)   Wt 99.8 kg   LMP  (Within Weeks) Comment: neg preg  SpO2 98%   BMI 41.57 kg/m   Constitutional :  alert, cooperative, appears stated age and no distress  Lymphatics/Throat:  no asymmetry, masses, or scars  Respiratory:  clear to auscultation bilaterally  Cardiovascular:  regular rate and rhythm  Gastrointestinal: Soft, no guarding, TTP in LUQ.   Musculoskeletal: Steady movement  Skin: Cool and moist  Psychiatric: Normal affect, non-agitated, not confused       LABS:  CMP Latest Ref Rng & Units 04/10/2020  Glucose 70 - 99 mg/dL 06/11/2020)  BUN 6 - 20 mg/dL 12  Creatinine 409(W - 1.19 mg/dL 1.47  Sodium 8.29 - 562 mmol/L 137  Potassium  3.5 - 5.1 mmol/L 4.3  Chloride 98 - 111 mmol/L 104  CO2 22 - 32 mmol/L 25  Calcium 8.9 - 10.3 mg/dL 9.3  Total Protein 6.5 - 8.1 g/dL 7.7  Total Bilirubin 0.3 - 1.2 mg/dL  0.8  Alkaline Phos 38 - 126 U/L 56  AST 15 - 41 U/L 32  ALT 0 - 44 U/L 57(H)   CBC Latest Ref Rng & Units 04/10/2020  WBC 4.0 - 10.5 K/uL 14.1(H)  Hemoglobin 12.0 - 15.0 g/dL 15.1(H)  Hematocrit 36 - 46 % 44.1  Platelets 150 - 400 K/uL 221    RADS: CLINICAL DATA:  LEFT lower quadrant abdominal pain and diarrhea.  EXAM: CT ABDOMEN AND PELVIS WITH CONTRAST  TECHNIQUE: Multidetector CT imaging of the abdomen and pelvis was performed using the standard protocol following bolus administration of intravenous contrast.  CONTRAST:  OMNIPAQUE IOHEXOL 300 MG/ML  SOLN  COMPARISON:  None.  FINDINGS: Lower chest: Mild atelectasis at the LEFT lung base.  Hepatobiliary: No focal liver abnormality is seen. No gallstones, gallbladder wall thickening, or biliary dilatation.  Pancreas: Unremarkable. No pancreatic ductal dilatation or surrounding inflammatory changes.  Spleen: Normal in size without focal abnormality.  Adrenals/Urinary Tract: Adrenal glands appear normal. Kidneys are unremarkable without mass, stone or hydronephrosis. No ureteral or bladder calculi identified. Bladder appears normal, partially decompressed.  Stomach/Bowel: No dilated large or small bowel loops. Focal segment of the proximal descending colon with wall thickening and pericolic inflammation, consistent with acute diverticulitis. Questionable tiny focus of extraluminal air adjacent to this involved segment of the descending colon.  Additional scattered diverticulosis within the transverse and sigmoid colon. Appendix is normal. Stomach is unremarkable, partially decompressed.  Vascular/Lymphatic: No significant vascular findings are present. No enlarged abdominal or pelvic lymph nodes.  Reproductive: Uterus and bilateral adnexa are unremarkable.  Other: No abscess collection seen. No free intraperitoneal air away from the involved segment of the descending  colon.  Musculoskeletal: No acute or significant osseous findings.  IMPRESSION: 1. Acute diverticulitis of the proximal descending colon, with associated bowel wall thickening and pericolic inflammation. Questionable tiny focus of extraluminal air immediately adjacent to this involved segment of the descending colon. No free intraperitoneal air elsewhere in the abdomen or pelvis. No abscess collection seen. 2. Additional scattered diverticulosis within the transverse and sigmoid colon.   Electronically Signed   By: Bary Richard M.D.   On: 04/10/2020 10:22  Assessment:   Acute diverticulitis, possible recurrent episodes.  Plan:    Discussed pathophysiology of diverticulitis in detail and treatment options including antibiotic therapy to surgical management.  Since this was a likely recurrent episode in a relatively young patient, surgery was consulted to discuss possible surgical resection to prevent further episodes.  I explained to the patient that currently there is not a good predictor tool to say how many more recurrent episodes she will have in her lifetime, nor the severity of each.    The patient firmly believes that  the specific instigating factors, which are increased seed intake.  She has not tried any diet modifications yet.  After discussion of the general risk, benefits, alternatives to the surgery itself, patient and family member at bedside opted to postpone any sort of surgical intervention for now, asking that we try some diet modifications to see if that will prevent any future episodes.  I provided her my contact information in case they would like to reconsider surgery as an outpatient basis to prevent further episodes.  Based  on her current H&P as well as clinical exam, I believe this episode will be resolved with antibiotics.  Further care per primary team as well as GI team at this point.  Surgery will peripherally follow and be available in case her  current episode persists or does not resolve with antibiotic therapy.  She also will not need any routine follow-up with me as an outpatient at this time.

## 2020-04-10 NOTE — ED Triage Notes (Addendum)
Onset today around lunch of LLQ pain. Pain woke her up tonight. Denies N/V. States she is unsure if this is a diverticulitis episode or something else.

## 2020-04-10 NOTE — ED Provider Notes (Addendum)
[facility] Emergency Department Provider Note   ____________________________________________   First MD Initiated Contact with Patient 04/10/20 0710     (approximate)  I have reviewed the triage vital signs and the nursing notes.   HISTORY  Chief Complaint Abdominal Pain (LLQ)   HPI Leah Baxter is a 24 y.o. female with history of IBS and very rarely has solid stools.  She reports she began having some left upper quadrant abdominal pain yesterday afternoon.  At onset it was very mild however got worse and woke her up in the middle of the night last night.  She is having some nausea but not vomiting.  Pain is worse with deep breathing and when she moves.  Moving would be rolling over in bed and even reaching around to her back.  She is not having a fever and she is not coughing.  She says she is not short of breath but she is not taking deep breaths either because of the pain.  She has not had this before.  Pain is sharp and stabbing and moderately severe.         Past Medical History:  Diagnosis Date  . Anxiety   . Depression     Patient Active Problem List   Diagnosis Date Noted  . Class 2 obesity due to excess calories without serious comorbidity with body mass index (BMI) of 36.0 to 36.9 in adult 08/02/2018  . Anxiety and depression 08/02/2018    History reviewed. No pertinent surgical history.  Prior to Admission medications   Medication Sig Start Date End Date Taking? Authorizing Provider  buPROPion (WELLBUTRIN XL) 300 MG 24 hr tablet TAKE 1 TABLET BY MOUTH EVERY DAY 01/05/20   Theadore Nan, NP    Allergies Penicillins  Family History  Problem Relation Age of Onset  . Hyperlipidemia Mother   . Hypertension Mother     Social History Social History   Tobacco Use  . Smoking status: Never Smoker  . Smokeless tobacco: Never Used  Vaping Use  . Vaping Use: Never used  Substance Use Topics  . Alcohol use: Yes  . Drug use:  Never    Review of Systems  Constitutional: No fever/chills Eyes: No visual changes. ENT: No sore throat. Cardiovascular: Denies chest pain. Respiratory: Denies shortness of breath. Gastrointestinal:  abdominal pain.   nausea, no vomiting.  No diarrhea.  No constipation. Genitourinary: Negative for dysuria. Musculoskeletal: Negative for back pain. Skin: Negative for rash. Neurological: Negative for headaches, focal weakness    ____________________________________________   PHYSICAL EXAM:  VITAL SIGNS: ED Triage Vitals  Enc Vitals Group     BP 04/10/20 0630 (!) 141/77     Pulse Rate 04/10/20 0630 80     Resp 04/10/20 0630 20     Temp 04/10/20 0630 99.2 F (37.3 C)     Temp Source 04/10/20 0630 Oral     SpO2 04/10/20 0630 97 %     Weight 04/10/20 0137 220 lb (99.8 kg)     Height 04/10/20 0137 5\' 1"  (1.549 m)     Head Circumference --      Peak Flow --      Pain Score 04/10/20 0137 8     Pain Loc --      Pain Edu? --      Excl. in GC? --     Constitutional: Alert and oriented. Well appearing and in no acute distress. Eyes: Conjunctivae are normal.  Head: Atraumatic. Nose: No  congestion/rhinnorhea. Mouth/Throat: Mucous membranes are moist.  Oropharynx non-erythematous. Neck: No stridor.  Cardiovascular: Normal rate, regular rhythm. Grossly normal heart sounds.  Good peripheral circulation. Respiratory: Normal respiratory effort.  No retractions. Lungs CTAB. Gastrointestinal: Soft tender to palpation in the left upper abdomen toward the mid axillary line.  Palpation of the other parts of the abdomen cause that area in the mid axillary line to be uncomfortable no distention. No abdominal bruits. No CVA tenderness. Musculoskeletal: No lower extremity tenderness nor edema.   Neurologic:  Normal speech and language. No gross focal neurologic deficits are appreciated. No gait instability. Skin:  Skin is warm, dry and intact. No rash  noted.   ____________________________________________   LABS (all labs ordered are listed, but only abnormal results are displayed)  Labs Reviewed  CBC - Abnormal; Notable for the following components:      Result Value   WBC 14.1 (*)    Hemoglobin 15.1 (*)    All other components within normal limits  URINALYSIS, COMPLETE (UACMP) WITH MICROSCOPIC - Abnormal; Notable for the following components:   Color, Urine YELLOW (*)    APPearance CLOUDY (*)    Protein, ur 30 (*)    Leukocytes,Ua TRACE (*)    Bacteria, UA FEW (*)    All other components within normal limits  COMPREHENSIVE METABOLIC PANEL - Abnormal; Notable for the following components:   Glucose, Bld 112 (*)    ALT 57 (*)    All other components within normal limits  FIBRIN DERIVATIVES D-DIMER ([facility] ONLY)  PREGNANCY, URINE  POC URINE PREG, ED  POCT PREGNANCY, URINE   ____________________________________________  EKG   ____________________________________________  RADIOLOGY  ED MD interpretation:    Official radiology report(s): CT ABDOMEN PELVIS W CONTRAST  Result Date: 04/10/2020 CLINICAL DATA:  LEFT lower quadrant abdominal pain and diarrhea. EXAM: CT ABDOMEN AND PELVIS WITH CONTRAST TECHNIQUE: Multidetector CT imaging of the abdomen and pelvis was performed using the standard protocol following bolus administration of intravenous contrast. CONTRAST:  OMNIPAQUE IOHEXOL 300 MG/ML  SOLN COMPARISON:  None. FINDINGS: Lower chest: Mild atelectasis at the LEFT lung base. Hepatobiliary: No focal liver abnormality is seen. No gallstones, gallbladder wall thickening, or biliary dilatation. Pancreas: Unremarkable. No pancreatic ductal dilatation or surrounding inflammatory changes. Spleen: Normal in size without focal abnormality. Adrenals/Urinary Tract: Adrenal glands appear normal. Kidneys are unremarkable without mass, stone or hydronephrosis. No ureteral or bladder calculi identified. Bladder appears normal,  partially decompressed. Stomach/Bowel: No dilated large or small bowel loops. Focal segment of the proximal descending colon with wall thickening and pericolic inflammation, consistent with acute diverticulitis. Questionable tiny focus of extraluminal air adjacent to this involved segment of the descending colon. Additional scattered diverticulosis within the transverse and sigmoid colon. Appendix is normal. Stomach is unremarkable, partially decompressed. Vascular/Lymphatic: No significant vascular findings are present. No enlarged abdominal or pelvic lymph nodes. Reproductive: Uterus and bilateral adnexa are unremarkable. Other: No abscess collection seen. No free intraperitoneal air away from the involved segment of the descending colon. Musculoskeletal: No acute or significant osseous findings. IMPRESSION: 1. Acute diverticulitis of the proximal descending colon, with associated bowel wall thickening and pericolic inflammation. Questionable tiny focus of extraluminal air immediately adjacent to this involved segment of the descending colon. No free intraperitoneal air elsewhere in the abdomen or pelvis. No abscess collection seen. 2. Additional scattered diverticulosis within the transverse and sigmoid colon. Electronically Signed   By: Bary Richard M.D.   On: 04/10/2020 10:22    ____________________________________________  PROCEDURES  Procedure(s) performed (including Critical Care):  Procedures   ____________________________________________   INITIAL IMPRESSION / ASSESSMENT AND PLAN / ED COURSE Patient CT scan shows diverticulitis with a microperforation.  This meets criteria for inpatient admission.  We will get her some antibiotics going I have called the hospitalist who wants me to call the surgeon.  We will do that as well.    ----------------------------------------- 12:12 PM on 04/10/2020 -----------------------------------------  I have consulted the surgeon he knows about the  patient.          ____________________________________________   FINAL CLINICAL IMPRESSION(S) / ED DIAGNOSES  Final diagnoses:  Diverticulitis     ED Discharge Orders    None       Note:  This document was prepared using Dragon voice recognition software and may include unintentional dictation errors.    Arnaldo Natal, MD 04/10/20 1201    Arnaldo Natal, MD 04/10/20 1212

## 2020-04-11 DIAGNOSIS — K5720 Diverticulitis of large intestine with perforation and abscess without bleeding: Secondary | ICD-10-CM | POA: Diagnosis present

## 2020-04-11 DIAGNOSIS — R109 Unspecified abdominal pain: Secondary | ICD-10-CM | POA: Diagnosis present

## 2020-04-11 DIAGNOSIS — Z6841 Body Mass Index (BMI) 40.0 and over, adult: Secondary | ICD-10-CM | POA: Diagnosis not present

## 2020-04-11 DIAGNOSIS — F419 Anxiety disorder, unspecified: Secondary | ICD-10-CM | POA: Diagnosis present

## 2020-04-11 DIAGNOSIS — Z20822 Contact with and (suspected) exposure to covid-19: Secondary | ICD-10-CM | POA: Diagnosis present

## 2020-04-11 DIAGNOSIS — Z8249 Family history of ischemic heart disease and other diseases of the circulatory system: Secondary | ICD-10-CM | POA: Diagnosis not present

## 2020-04-11 DIAGNOSIS — K5792 Diverticulitis of intestine, part unspecified, without perforation or abscess without bleeding: Secondary | ICD-10-CM | POA: Diagnosis not present

## 2020-04-11 DIAGNOSIS — K589 Irritable bowel syndrome without diarrhea: Secondary | ICD-10-CM | POA: Diagnosis present

## 2020-04-11 DIAGNOSIS — Z880 Allergy status to penicillin: Secondary | ICD-10-CM | POA: Diagnosis not present

## 2020-04-11 DIAGNOSIS — Z79899 Other long term (current) drug therapy: Secondary | ICD-10-CM | POA: Diagnosis not present

## 2020-04-11 DIAGNOSIS — F329 Major depressive disorder, single episode, unspecified: Secondary | ICD-10-CM | POA: Diagnosis present

## 2020-04-11 DIAGNOSIS — Z83438 Family history of other disorder of lipoprotein metabolism and other lipidemia: Secondary | ICD-10-CM | POA: Diagnosis not present

## 2020-04-11 LAB — CBC
HCT: 39.2 % (ref 36.0–46.0)
Hemoglobin: 13.3 g/dL (ref 12.0–15.0)
MCH: 31.7 pg (ref 26.0–34.0)
MCHC: 33.9 g/dL (ref 30.0–36.0)
MCV: 93.6 fL (ref 80.0–100.0)
Platelets: 254 10*3/uL (ref 150–400)
RBC: 4.19 MIL/uL (ref 3.87–5.11)
RDW: 12.4 % (ref 11.5–15.5)
WBC: 12.5 10*3/uL — ABNORMAL HIGH (ref 4.0–10.5)
nRBC: 0 % (ref 0.0–0.2)

## 2020-04-11 LAB — BASIC METABOLIC PANEL
Anion gap: 7 (ref 5–15)
BUN: 6 mg/dL (ref 6–20)
CO2: 25 mmol/L (ref 22–32)
Calcium: 8.6 mg/dL — ABNORMAL LOW (ref 8.9–10.3)
Chloride: 105 mmol/L (ref 98–111)
Creatinine, Ser: 0.97 mg/dL (ref 0.44–1.00)
GFR calc Af Amer: >60 mL/min (ref >60)
GFR calc non Af Amer: >60 mL/min (ref >60)
Glucose, Bld: 101 mg/dL — ABNORMAL HIGH (ref 70–99)
Potassium: 4.5 mmol/L (ref 3.5–5.1)
Sodium: 137 mmol/L (ref 135–145)

## 2020-04-11 LAB — HIV ANTIBODY (ROUTINE TESTING W REFLEX): HIV Screen 4th Generation wRfx: NONREACTIVE

## 2020-04-11 MED ORDER — KETOROLAC TROMETHAMINE 30 MG/ML IJ SOLN
30.0000 mg | Freq: Four times a day (QID) | INTRAMUSCULAR | Status: DC | PRN
  Administered 2020-04-11 (×2): 30 mg via INTRAVENOUS
  Filled 2020-04-11 (×2): qty 1

## 2020-04-11 MED ORDER — TRAMADOL HCL 50 MG PO TABS
50.0000 mg | ORAL_TABLET | Freq: Four times a day (QID) | ORAL | Status: DC | PRN
  Administered 2020-04-11: 50 mg via ORAL
  Filled 2020-04-11: qty 1

## 2020-04-11 NOTE — Progress Notes (Addendum)
[facility]  - [place] [facility]   PATIENT NAME: Leah Baxter    MR#:  [id number]  DATE OF BIRTH:  March 28, 1996  SUBJECTIVE:  patient had a bowel movement thereafter started having left lower quadrant pain. No blood in stool. Family in the room. She is tearful after getting IV morphine and having a weird feeling. Tolerated clear liquid diet.  REVIEW OF SYSTEMS:   Review of Systems  Constitutional: Negative for chills, fever and weight loss.  HENT: Negative for ear discharge, ear pain and nosebleeds.   Eyes: Negative for blurred vision, pain and discharge.  Respiratory: Negative for sputum production, shortness of breath, wheezing and stridor.   Cardiovascular: Negative for chest pain, palpitations, orthopnea and PND.  Gastrointestinal: Positive for abdominal pain. Negative for diarrhea, nausea and vomiting.  Genitourinary: Negative for frequency and urgency.  Musculoskeletal: Negative for back pain and joint pain.  Neurological: Negative for sensory change, speech change, focal weakness and weakness.  Psychiatric/Behavioral: Negative for depression and hallucinations. The patient is not nervous/anxious.    Tolerating Diet:CLD   DRUG ALLERGIES:   Allergies  Allergen Reactions  . Penicillins Hives    Childhood allergy    VITALS:  Blood pressure 134/88, pulse (!) 109, temperature 99.3 F (37.4 C), temperature source Oral, resp. rate 20, height 5\' 1"  (1.549 m), weight 99.8 kg, SpO2 97 %.  PHYSICAL EXAMINATION:   Physical Exam  GENERAL:  24 y.o.-year-old patient lying in the bed with no acute distress. Obese EYES: Pupils equal, round, reactive to light and accommodation. No scleral icterus.   HEENT: Head atraumatic, normocephalic. Oropharynx and nasopharynx clear.  NECK:  Supple, no jugular venous distention. No thyroid enlargement, no tenderness.  LUNGS: Normal breath sounds bilaterally, no wheezing, rales, rhonchi. No use of accessory muscles of  respiration.  CARDIOVASCULAR: S1, S2 normal. No murmurs, rubs, or gallops.  ABDOMEN: Soft, LLQ tender +, nondistended. Bowel sounds present. No organomegaly or mass.  EXTREMITIES: No cyanosis, clubbing or edema b/l.    NEUROLOGIC: Cranial nerves II through XII are intact. No focal Motor or sensory deficits b/l.   PSYCHIATRIC:  patient is alert and oriented x 3.  SKIN: No obvious rash, lesion, or ulcer.   LABORATORY PANEL:  CBC Recent Labs  Lab 04/11/20 0742  WBC 12.5*  HGB 13.3  HCT 39.2  PLT 254    Chemistries  Recent Labs  Lab 04/10/20 0210 04/10/20 0210 04/11/20 0742  NA 137   < > 137  K 4.3   < > 4.5  CL 104   < > 105  CO2 25   < > 25  GLUCOSE 112*   < > 101*  BUN 12   < > 6  CREATININE 0.91   < > 0.97  CALCIUM 9.3   < > 8.6*  AST 32  --   --   ALT 57*  --   --   ALKPHOS 56  --   --   BILITOT 0.8  --   --    < > = values in this interval not displayed.   Cardiac Enzymes No results for input(s): TROPONINI in the last 168 hours. RADIOLOGY:  CT ABDOMEN PELVIS W CONTRAST  Result Date: 04/10/2020 CLINICAL DATA:  LEFT lower quadrant abdominal pain and diarrhea. EXAM: CT ABDOMEN AND PELVIS WITH CONTRAST TECHNIQUE: Multidetector CT imaging of the abdomen and pelvis was performed using the standard protocol following bolus administration of intravenous contrast. CONTRAST:  06/11/2020 OMNIPAQUE IOHEXOL 300 MG/ML  SOLN COMPARISON:  None. FINDINGS: Lower chest: Mild atelectasis at the LEFT lung base. Hepatobiliary: No focal liver abnormality is seen. No gallstones, gallbladder wall thickening, or biliary dilatation. Pancreas: Unremarkable. No pancreatic ductal dilatation or surrounding inflammatory changes. Spleen: Normal in size without focal abnormality. Adrenals/Urinary Tract: Adrenal glands appear normal. Kidneys are unremarkable without mass, stone or hydronephrosis. No ureteral or bladder calculi identified. Bladder appears normal, partially decompressed. Stomach/Bowel: No  dilated large or small bowel loops. Focal segment of the proximal descending colon with wall thickening and pericolic inflammation, consistent with acute diverticulitis. Questionable tiny focus of extraluminal air adjacent to this involved segment of the descending colon. Additional scattered diverticulosis within the transverse and sigmoid colon. Appendix is normal. Stomach is unremarkable, partially decompressed. Vascular/Lymphatic: No significant vascular findings are present. No enlarged abdominal or pelvic lymph nodes. Reproductive: Uterus and bilateral adnexa are unremarkable. Other: No abscess collection seen. No free intraperitoneal air away from the involved segment of the descending colon. Musculoskeletal: No acute or significant osseous findings. IMPRESSION: 1. Acute diverticulitis of the proximal descending colon, with associated bowel wall thickening and pericolic inflammation. Questionable tiny focus of extraluminal air immediately adjacent to this involved segment of the descending colon. No free intraperitoneal air elsewhere in the abdomen or pelvis. No abscess collection seen. 2. Additional scattered diverticulosis within the transverse and sigmoid colon. Electronically Signed   By: Bary Richard M.D.   On: 04/10/2020 10:22   ASSESSMENT AND PLAN:    Leah Baxter is a 24 y.o. female with medical history significant for inflammatory bowel syndrome, anxiety and depression who presents to the emergency room for evaluation of left lower quadrant/lumbar abdominal pain that started 1 day prior to admission after a meal.She rates her pain an 8 x 10 in intensity at its worst and states that intensity has been increasing since its onset.  She describes it as a sharp and stabbing pain that is nonradiating and is associated with nausea but no vomiting  Acute diverticulitis-- recurrent about fifth episode this year -Patient presents to the emergency room for evaluation of sudden onset left lower quadrant  pain associated with nausea but no vomiting -CT scan of the abdomen and pelvis confirms acute diverticulitis with microperforation -continue IV Cipro and Flagyl - surgical consult with Dr. Tonna Boehringer appreciated. Continue conservative management with IV antibiotics liquid diet and pain control. No immediate surgical indication.  -Pain control - patient not tolerating narcotics well.continue Tylenol, Ultram and PRN Toradol -request dietary consult to provide patient with a list of foods to avoid to prevent further flareup  -G.I. consulted with Dr. Tobi Bastos appreciated  Anxiety and depression Continue bupropion  Morbid obesity (BMI 41.57) Complicates overall prognosis and care   DVT prophylaxis: Lovenox Code Status: Full code Family Communication: Greater than 50% of time was spent discussing plan of care with patient at the bedside.  All questions and concerns have been addressed.  She verbalizes understanding and agrees with plan of care Disposition Plan: Back to previous home environment Consults called: Surgery, GI Admission: Inpatient patient continues to have abdominal pain requiring IV antibiotics, IV and PO pain meds and follow-up with G.I. surgery. She is medically not stable for discharge given the level of pain.    TOTAL TIME TAKING CARE OF THIS PATIENT: *25* minutes.  >50% time spent on counselling and coordination of care  Note: This dictation was prepared with Dragon dictation along with smaller phrase technology. Any transcriptional errors that result from this process are unintentional.  Leah Baxter  Allena Katz M.D    [facility]   CC: Primary care physician; Theadore Nan, NPPatient ID: Leah Baxter, female   DOB: [birth year], 24 y.o.   MRN: [id number]

## 2020-04-11 NOTE — Consult Note (Signed)
Wyline Mood , MD [address], [place], [place], [address] [address], [address], [place], [place], [address] Phone: [phone]  Fax: [phone]  Consultation  Referring Provider:     Dr Allena Katz  Primary Care Physician:  Theadore Nan, NP Primary Gastroenterologist:  Dr. Servando Snare          Reason for Consultation:     Acute diverticulitis  Date of Admission:  04/10/2020 Date of Consultation:  04/11/2020         HPI:   Oline Belk is a 24 y.o. female known to Dr Servando Snare who saw her at his office in 01/2020 for abdominal pain. LLQ on multiple occasions which was empirically treated for diverticulitis . No prior CT scan of the abdomen to confirm these episodes and also had never had a colonoscopy.   She presented to the ER yesterday with LUQ abdominal pain , underwent a CT scan of the abdomen that demonstrated acute diverticulitis in the proximal descending colon with questionable tiny focus of extraluminal air . In addition diverticulosis of the transverse and sigmoid colon.WCC 14.1.  Commenced on antibiotics . Seen by Dr Tonna Boehringer , surgery deferred for now.   Feels better today than yesterday , some cramping after a bowel movement .    Past Medical History:  Diagnosis Date  . Anxiety   . Depression     History reviewed. No pertinent surgical history.  Prior to Admission medications   Medication Sig Start Date End Date Taking? Authorizing Provider  buPROPion (WELLBUTRIN XL) 300 MG 24 hr tablet TAKE 1 TABLET BY MOUTH EVERY DAY Patient taking differently: Take 300 mg by mouth daily.  01/05/20  Yes Theadore Nan, NP    Family History  Problem Relation Age of Onset  . Hyperlipidemia Mother   . Hypertension Mother      Social History   Tobacco Use  . Smoking status: Never Smoker  . Smokeless tobacco: Never Used  Vaping Use  . Vaping Use: Never used  Substance Use Topics  . Alcohol use: Yes  . Drug use: Never    Allergies as of 04/10/2020 - Review Complete  04/10/2020  Allergen Reaction Noted  . Penicillins Hives 08/01/2018    Review of Systems:    All systems reviewed and negative except where noted in HPI.   Physical Exam:  Vital signs in last 24 hours: Temp:  [98 F (36.7 C)-100.1 F (37.8 C)] 98 F (36.7 C) (07/11 0413) Pulse Rate:  [85-106] 98 (07/11 0413) Resp:  [16-20] 18 (07/11 0413) BP: (105-129)/(63-88) 105/63 (07/11 0413) SpO2:  [95 %-100 %] 99 % (07/11 0413)   General:   Pleasant, cooperative in NAD Head:  Normocephalic and atraumatic. Eyes:   No icterus.   Conjunctiva pink. PERRLA. Ears:  Normal auditory acuity. Abdomen:  Soft, nondistended, nontender. Normal bowel sounds. No appreciable masses or hepatomegaly.  No rebound or guarding.  Neurologic:  Alert and oriented x3;  grossly normal neurologically. Skin:  Intact without significant lesions or rashes. Cervical Nodes:  No significant cervical adenopathy. Psych:  Alert and cooperative. Normal affect.  LAB RESULTS: Recent Labs    04/10/20 0135 04/11/20 0742  WBC 14.1* 12.5*  HGB 15.1* 13.3  HCT 44.1 39.2  PLT 221 254   BMET Recent Labs    04/10/20 0210 04/11/20 0742  NA 137 137  K 4.3 4.5  CL 104 105  CO2 25 25  GLUCOSE 112* 101*  BUN 12 6  CREATININE 0.91  0.97  CALCIUM 9.3 8.6*   LFT Recent Labs    04/10/20 0210  PROT 7.7  ALBUMIN 4.5  AST 32  ALT 57*  ALKPHOS 56  BILITOT 0.8   PT/INR No results for input(s): LABPROT, INR in the last 72 hours.  STUDIES: CT ABDOMEN PELVIS W CONTRAST  Result Date: 04/10/2020 CLINICAL DATA:  LEFT lower quadrant abdominal pain and diarrhea. EXAM: CT ABDOMEN AND PELVIS WITH CONTRAST TECHNIQUE: Multidetector CT imaging of the abdomen and pelvis was performed using the standard protocol following bolus administration of intravenous contrast. CONTRAST:  OMNIPAQUE IOHEXOL 300 MG/ML  SOLN COMPARISON:  None. FINDINGS: Lower chest: Mild atelectasis at the LEFT lung base. Hepatobiliary: No focal liver  abnormality is seen. No gallstones, gallbladder wall thickening, or biliary dilatation. Pancreas: Unremarkable. No pancreatic ductal dilatation or surrounding inflammatory changes. Spleen: Normal in size without focal abnormality. Adrenals/Urinary Tract: Adrenal glands appear normal. Kidneys are unremarkable without mass, stone or hydronephrosis. No ureteral or bladder calculi identified. Bladder appears normal, partially decompressed. Stomach/Bowel: No dilated large or small bowel loops. Focal segment of the proximal descending colon with wall thickening and pericolic inflammation, consistent with acute diverticulitis. Questionable tiny focus of extraluminal air adjacent to this involved segment of the descending colon. Additional scattered diverticulosis within the transverse and sigmoid colon. Appendix is normal. Stomach is unremarkable, partially decompressed. Vascular/Lymphatic: No significant vascular findings are present. No enlarged abdominal or pelvic lymph nodes. Reproductive: Uterus and bilateral adnexa are unremarkable. Other: No abscess collection seen. No free intraperitoneal air away from the involved segment of the descending colon. Musculoskeletal: No acute or significant osseous findings. IMPRESSION: 1. Acute diverticulitis of the proximal descending colon, with associated bowel wall thickening and pericolic inflammation. Questionable tiny focus of extraluminal air immediately adjacent to this involved segment of the descending colon. No free intraperitoneal air elsewhere in the abdomen or pelvis. No abscess collection seen. 2. Additional scattered diverticulosis within the transverse and sigmoid colon. Electronically Signed   By: Bary Richard M.D.   On: 04/10/2020 10:22      Impression / Plan:   Rumaisa Schnetzer is a 24 y.o. y/o female admitted with acute diverticulitis with possible contained perforation. Based on history this is probably not the first episode but this is the first confirmed  on CT scan . Seen by Dr Tonna Boehringer and not for any surgery at this time.   Plan  1. Continue antibiotics, advance diet gradually as tolerated  2. IF pain free can transition to oral antibiotics later prior to discharge  3. Follow up with Dr Servando Snare for a colonoscopy in 6-8 weeks   I have discussed plan with Dr Allena Katz  Thank you for involving me in the care of this patient.      LOS: 0 days   Wyline Mood, MD  04/11/2020, 9:52 AM

## 2020-04-12 MED ORDER — METRONIDAZOLE 500 MG PO TABS: 500.0000 mg | ORAL_TABLET | Freq: Three times a day (TID) | ORAL | 0 refills | Status: AC

## 2020-04-12 MED ORDER — TRAMADOL HCL 50 MG PO TABS: 50.0000 mg | ORAL_TABLET | Freq: Three times a day (TID) | ORAL | 0 refills | Status: DC | PRN

## 2020-04-12 MED ORDER — METRONIDAZOLE 500 MG PO TABS
500.0000 mg | ORAL_TABLET | Freq: Three times a day (TID) | ORAL | Status: DC
  Administered 2020-04-12: 500 mg via ORAL
  Filled 2020-04-12 (×2): qty 1

## 2020-04-12 MED ORDER — CIPROFLOXACIN HCL 500 MG PO TABS: 500.0000 mg | ORAL_TABLET | Freq: Two times a day (BID) | ORAL | 0 refills | Status: AC

## 2020-04-12 MED ORDER — BOOST / RESOURCE BREEZE PO LIQD CUSTOM
1.0000 | Freq: Three times a day (TID) | ORAL | Status: DC
  Administered 2020-04-12: 1 via ORAL

## 2020-04-12 MED ORDER — CIPROFLOXACIN HCL 500 MG PO TABS: 500.0000 mg | ORAL_TABLET | Freq: Two times a day (BID) | ORAL | Status: DC

## 2020-04-12 NOTE — Progress Notes (Signed)
Leah Baxter , MD [address], [address], [place], [place], [address] [address], [place], [place], [address] Phone: [phone]  Fax: [phone]   Leah Baxter is being followed for acute diverticulitis  Day 1 of follow up   Subjective:   Feels much better.  No significant abdominal pain.  Eating better.   Objective: Vital signs in last 24 hours: Vitals:   04/11/20 1144 04/11/20 2036 04/11/20 2353 04/12/20 0517  BP: 134/88 123/81  110/71  Pulse: (!) 109 (!) 105 98 84  Resp: 20 20  20   Temp: 99.3 F (37.4 C) 99 F (37.2 C)  97.9 F (36.6 C)  TempSrc: Oral Oral  Oral  SpO2: 97% 100%  98%  Weight:      Height:       Weight change:   Intake/Output Summary (Last 24 hours) at 04/12/2020 1046 Last data filed at 04/12/2020 1022 Gross per 24 hour  Intake 2696.05 ml  Output 1200 ml  Net 1496.05 ml     Exam:  Abdomen: soft, nontender, normal bowel sounds   Lab Results: @LABTEST2 @ Micro Results: Recent Results (from the past 240 hour(s))  SARS Coronavirus 2 by RT PCR (hospital order, performed in [facility] hospital lab) Nasopharyngeal Nasopharyngeal Swab     Status: None   Collection Time: 04/10/20 12:32 PM   Specimen: Nasopharyngeal Swab  Result Value Ref Range Status   SARS Coronavirus 2 NEGATIVE NEGATIVE Final    Comment: (NOTE) SARS-CoV-2 target nucleic acids are NOT DETECTED.  The SARS-CoV-2 RNA is generally detectable in upper and lower respiratory specimens during the acute phase of infection. The lowest concentration of SARS-CoV-2 viral copies this assay can detect is 250 copies / mL. A negative result does not preclude SARS-CoV-2 infection and should not be used as the sole basis for treatment or other patient management decisions.  A negative result may occur with improper specimen collection / handling, submission of specimen other than nasopharyngeal swab, presence of viral mutation(s) within the areas targeted by this assay, and  inadequate number of viral copies (<250 copies / mL). A negative result must be combined with clinical observations, patient history, and epidemiological information.  Fact Sheet for Patients:   [facility]  Fact Sheet for Healthcare Providers: 06/11/20  This test is not yet approved or  cleared by the [employer] FDA and has been authorized for detection and/or diagnosis of SARS-CoV-2 by FDA under an Emergency Use Authorization (EUA).  This EUA will remain in effect (meaning this test can be used) for the duration of the COVID-19 declaration under Section 564(b)(1) of the Act, 21 U.S.C. section 360bbb-3(b)(1), unless the authorization is terminated or revoked sooner.  Performed at [facility], [address], [place], [address] [place]    Studies/Results: No results found. Medications: I have reviewed the patient's current medications. Scheduled Meds: . buPROPion  300 mg Oral Daily  . enoxaparin (LOVENOX) injection  40 mg Subcutaneous BID  . pantoprazole (PROTONIX) IV  40 mg Intravenous Q24H   Continuous Infusions: . sodium chloride 100 mL/hr at 04/12/20 0132  . ciprofloxacin 400 mg (04/12/20 1028)  . metronidazole 500 mg (04/12/20 0526)   PRN Meds:.iohexol, ketorolac, ondansetron **OR** ondansetron (ZOFRAN) IV, traMADol   Assessment: Principal Problem:   Acute diverticulitis Active Problems:   Class 2 obesity due to excess calories without serious comorbidity with body mass index (BMI) of 36.0 to 36.9 in adult   Anxiety and depression   Diverticulitis  06/13/20  Leah Baxter is a 24 y.o. y/o female admitted with acute diverticulitis with possible contained perforation. Based on history this is probably not the first episode but this is the first confirmed on CT scan . Seen by Dr Tonna Boehringer and not for any surgery at this time.   Plan  1. Continue antibiotics, advance diet gradually as tolerated  2.   Change to oral antibiotics to complete at home 3. Follow up with Dr Servando Snare for a colonoscopy in 6-8 weeks   I will sign off.  Please call me if any further GI concerns or questions.  We would like to thank you for the opportunity to participate in the care of Leah Baxter.   LOS: 1 day   Leah Mood, MD 04/12/2020, 10:46 AM

## 2020-04-12 NOTE — Progress Notes (Signed)
Leah Baxter  A and O x 4. VSS. Pt tolerating diet well. No complaints of pain or nausea. IV removed intact, prescriptions given. Pt voiced understanding of discharge instructions with no further questions. Pt discharged via wheelchair with RN.     Allergies as of 04/12/2020      Reactions   Penicillins Hives   Childhood allergy      Medication List    TAKE these medications   buPROPion 300 MG 24 hr tablet Commonly known as: WELLBUTRIN XL TAKE 1 TABLET BY MOUTH EVERY DAY   ciprofloxacin 500 MG tablet Commonly known as: CIPRO Take 1 tablet (500 mg total) by mouth 2 (two) times daily for 9 doses.   metroNIDAZOLE 500 MG tablet Commonly known as: FLAGYL Take 1 tablet (500 mg total) by mouth every 8 (eight) hours for 14 doses.   traMADol 50 MG tablet Commonly known as: ULTRAM Take 1 tablet (50 mg total) by mouth every 8 (eight) hours as needed for moderate pain or severe pain.       Vitals:   04/12/20 0517 04/12/20 1210  BP: 110/71 116/81  Pulse: 84 92  Resp: 20 16  Temp: 97.9 F (36.6 C) 97.8 F (36.6 C)  SpO2: 98% 98%    Suzzanne Cloud

## 2020-04-12 NOTE — Discharge Summary (Signed)
[facility] [place] [facility]   PATIENT NAME: Leah Baxter    MR#:  [id number]  DATE OF BIRTH:  [birth year]  DATE OF ADMISSION:  04/10/2020 ADMITTING PHYSICIAN: Enedina Finner, MD  DATE OF DISCHARGE: 04/12/2020  PRIMARY CARE PHYSICIAN: Theadore Nan, NP    ADMISSION DIAGNOSIS:  Diverticulitis [K57.92] Acute diverticulitis [K57.92]  DISCHARGE DIAGNOSIS:  Acute recurrent Diverticulitis  SECONDARY DIAGNOSIS:   Past Medical History:  Diagnosis Date  . Anxiety   . Depression     HOSPITAL COURSE:   Leah Baxter a 24 y.o.femalewith medical history significant forinflammatory bowel syndrome, anxiety and depression who presents to the emergency room for evaluation of left lower quadrant/lumbar abdominal pain that started 1 day prior to admission after a meal.She rates her pain an 8 x 10 in intensity at its worst and states that intensity has been increasing since its onset. She describes it as a sharp and stabbing pain that isnonradiating and is associated with nausea but no vomiting  Acute diverticulitis-- recurrent about fifth episode this year -Patient presents to the emergency room for evaluation of sudden onset left lower quadrant pain associated with nausea but no vomiting -CT scan of the abdomen and pelvis confirms acute diverticulitiswith microperforation -continue IV Cipro and Flagyl-- change to oral antibiotic. - surgical consult with Dr. Tonna Boehringer appreciated. Continue conservative management with  Antibiotics, full liquid diet and pain control. No immediate surgical indication.  -Pain control - continue Tylenol, Ultram prn -request dietary consult to provide patient with a list of foods to avoid to prevent further flareup -G.I. consulted with Dr. Tobi Bastos appreciated-- okay to go home from G.I. standpoint patient feels a lot better. Requesting to go home.-  Anxiety and depression Continue bupropion  Morbid obesity(BMI 41.57) diet and  exercise recommended   DVT prophylaxis:Lovenox Code Status:Full code Family Communication:Greater than 50% of time was spent discussing plan of care with patient at the bedside. All questions and concerns have been addressed. She verbalizes understanding and agrees with plan of care Disposition Plan:Back to previous home environment Consults called:Surgery, GI Admission: Inpatient  Patient improved remarkably. Eager to go home. Okay with G.I. to go home. She'll follow up with gastroenterology as outpatient and finish up course of antibiotic. CONSULTS OBTAINED:  Treatment Team:  Sung Amabile, DO Wyline Mood, MD  DRUG ALLERGIES:   Allergies  Allergen Reactions  . Penicillins Hives    Childhood allergy    DISCHARGE MEDICATIONS:   Allergies as of 04/12/2020      Reactions   Penicillins Hives   Childhood allergy      Medication List    TAKE these medications   buPROPion 300 MG 24 hr tablet Commonly known as: WELLBUTRIN XL TAKE 1 TABLET BY MOUTH EVERY DAY   ciprofloxacin 500 MG tablet Commonly known as: CIPRO Take 1 tablet (500 mg total) by mouth 2 (two) times daily for 9 doses.   metroNIDAZOLE 500 MG tablet Commonly known as: FLAGYL Take 1 tablet (500 mg total) by mouth every 8 (eight) hours for 14 doses.   traMADol 50 MG tablet Commonly known as: ULTRAM Take 1 tablet (50 mg total) by mouth every 8 (eight) hours as needed for moderate pain or severe pain.       If you experience worsening of your admission symptoms, develop shortness of breath, life threatening emergency, suicidal or homicidal thoughts you must seek medical attention immediately by calling 911 or calling your MD immediately  if symptoms less severe.  You  Must read complete instructions/literature along with all the possible adverse reactions/side effects for all the Medicines you take and that have been prescribed to you. Take any new Medicines after you have completely understood and accept  all the possible adverse reactions/side effects.   Please note  You were cared for by a hospitalist during your hospital stay. If you have any questions about your discharge medications or the care you received while you were in the hospital after you are discharged, you can call the unit and asked to speak with the hospitalist on call if the hospitalist that took care of you is not available. Once you are discharged, your primary care physician will handle any further medical issues. Please note that NO REFILLS for any discharge medications will be authorized once you are discharged, as it is imperative that you return to your primary care physician (or establish a relationship with a primary care physician if you do not have one) for your aftercare needs so that they can reassess your need for medications and monitor your lab values. Today   SUBJECTIVE   I'm feeling a lot better. I am eager to go home  VITAL SIGNS:  Blood pressure 116/81, pulse 92, temperature 97.8 F (36.6 C), resp. rate 16, height 5\' 1"  (1.549 m), weight 99.8 kg, SpO2 98 %.  I/O:    Intake/Output Summary (Last 24 hours) at 04/12/2020 1302 Last data filed at 04/12/2020 1022 Gross per 24 hour  Intake 2696.05 ml  Output 900 ml  Net 1796.05 ml    PHYSICAL EXAMINATION:  GENERAL:  24 y.o.-year-old patient lying in the bed with no acute distress. Obese LUNGS: Normal breath sounds bilaterally, no wheezing, rales,rhonchi or crepitation. No use of accessory muscles of respiration.  CARDIOVASCULAR: S1, S2 normal. No murmurs, rubs, or gallops.  ABDOMEN: Soft, non-tender, non-distended. Bowel sounds present. No organomegaly or mass.  EXTREMITIES: No pedal edema, cyanosis, or clubbing.  NEUROLOGIC: Cranial nerves II through XII are intact. Muscle strength 5/5 in all extremities. Sensation intact. Gait not checked.  PSYCHIATRIC: The patient is alert and oriented x 3.  SKIN: No obvious rash, lesion, or ulcer.   DATA REVIEW:    CBC  Recent Labs  Lab 04/11/20 0742  WBC 12.5*  HGB 13.3  HCT 39.2  PLT 254    Chemistries  Recent Labs  Lab 04/10/20 0210 04/10/20 0210 04/11/20 0742  NA 137   < > 137  K 4.3   < > 4.5  CL 104   < > 105  CO2 25   < > 25  GLUCOSE 112*   < > 101*  BUN 12   < > 6  CREATININE 0.91   < > 0.97  CALCIUM 9.3   < > 8.6*  AST 32  --   --   ALT 57*  --   --   ALKPHOS 56  --   --   BILITOT 0.8  --   --    < > = values in this interval not displayed.    Microbiology Results   Recent Results (from the past 240 hour(s))  SARS Coronavirus 2 by RT PCR (hospital order, performed in [facility] hospital lab) Nasopharyngeal Nasopharyngeal Swab     Status: None   Collection Time: 04/10/20 12:32 PM   Specimen: Nasopharyngeal Swab  Result Value Ref Range Status   SARS Coronavirus 2 NEGATIVE NEGATIVE Final    Comment: (NOTE) SARS-CoV-2 target nucleic acids are NOT DETECTED.  The SARS-CoV-2  RNA is generally detectable in upper and lower respiratory specimens during the acute phase of infection. The lowest concentration of SARS-CoV-2 viral copies this assay can detect is 250 copies / mL. A negative result does not preclude SARS-CoV-2 infection and should not be used as the sole basis for treatment or other patient management decisions.  A negative result may occur with improper specimen collection / handling, submission of specimen other than nasopharyngeal swab, presence of viral mutation(s) within the areas targeted by this assay, and inadequate number of viral copies (<250 copies / mL). A negative result must be combined with clinical observations, patient history, and epidemiological information.  Fact Sheet for Patients:   [employer]  Fact Sheet for Healthcare Providers: https://pope.com/  This test is not yet approved or  cleared by the [place] FDA and has been authorized for detection and/or diagnosis of  SARS-CoV-2 by FDA under an Emergency Use Authorization (EUA).  This EUA will remain in effect (meaning this test can be used) for the duration of the COVID-19 declaration under Section 564(b)(1) of the Act, 21 U.S.C. section 360bbb-3(b)(1), unless the authorization is terminated or revoked sooner.  Performed at [facility], [address] [place], [place] [address]     RADIOLOGY:  No results found.   CODE STATUS:     Code Status Orders  (From admission, onward)         Start     Ordered   04/10/20 1326  Full code  Continuous        04/10/20 1326        Code Status History    This patient has a current code status but no historical code status.   Advance Care Planning Activity       TOTAL TIME TAKING CARE OF THIS PATIENT: *35 minutes.    Enedina Finner M.D  [facility]    CC: Primary care physician; Theadore Nan, NP

## 2020-04-12 NOTE — Plan of Care (Signed)
Nutrition Education Note  RD consulted for nutrition education for diverticulitis.  24 y/o female with recurrent diverticulitis   RD provided "Nutrition and Diverticulitis" handout with supporting information. Reviewed patient's dietary recall. Provided examples of low and high fiber foods. Discouraged intake of high fiber foods, high fat foods, spicy foods, processed foods, caffeine and red meats when having a flare. Encouraged pt to cook foods until they are soft and chew foods well to help aid in digestion. Also recommend frequent small meals. Encouraged use of a multi-vitamin and protein supplements while having a flare.    RD encouraged intake of high fiber foods when not having a flare. Pt was provided a list of fiber containing foods.   Expect good compliance.  Body mass index is 41.57 kg/m. Pt meets criteria for obesity based on current BMI.  Current diet order is FL, patient is consuming approximately 100% of meals at this time. Labs and medications reviewed. No further nutrition interventions warranted at this time. RD contact information provided. If additional nutrition issues arise, please re-consult RD.  Betsey Holiday MS, RD, LDN Please refer to [facility] for RD and/or RD on-call/weekend/after hours pager

## 2020-04-14 ENCOUNTER — Telehealth: Payer: Self-pay

## 2020-04-14 NOTE — Telephone Encounter (Signed)
Pt left vm stating the one of the medications she was prescribed during her hospital visit has been causing nausea and vomiting. Pt states she's not sure which medication is making her sick so she'd like to know if Dr. Tobi Bastos recommends a different medication regimen?

## 2020-04-14 NOTE — Telephone Encounter (Signed)
Failed attempt to reach patient for transition of care. No answer. Appointment scheduled with PMD 04/19/20 @ 11:30. Keep all scheduled appointments. Will follow as appropriate.

## 2020-04-15 NOTE — Telephone Encounter (Signed)
Usually the Flagyl which is associated with nausea.  If she desires we can give with some Zofran 4 mg as needed up to 3 times daily to be used for nausea ,please dispense 12 tablets.  I would expect the nausea to resolve once she completes a course of Flagyl.

## 2020-04-15 NOTE — Telephone Encounter (Signed)
Second failed attempt to reach patient for transition of care. No answer. Unable to leave a message, mailbox full. Keep hospital follow up as scheduled. TCM closed.

## 2020-04-15 NOTE — Telephone Encounter (Signed)
Would need to know which medication she is mentioning about

## 2020-04-16 NOTE — Telephone Encounter (Signed)
Noted  

## 2020-04-16 NOTE — Telephone Encounter (Signed)
FYI Pt called and cancelled appt for 7/19 said she would just call back

## 2020-04-19 ENCOUNTER — Ambulatory Visit: Payer: Managed Care, Other (non HMO) | Admitting: Nurse Practitioner

## 2020-05-27 ENCOUNTER — Other Ambulatory Visit: Payer: Self-pay | Admitting: Critical Care Medicine

## 2020-05-27 ENCOUNTER — Other Ambulatory Visit: Payer: Managed Care, Other (non HMO)

## 2020-05-27 DIAGNOSIS — Z20822 Contact with and (suspected) exposure to covid-19: Secondary | ICD-10-CM

## 2020-05-28 LAB — NOVEL CORONAVIRUS, NAA: SARS-CoV-2, NAA: NOT DETECTED

## 2020-05-28 LAB — SARS-COV-2, NAA 2 DAY TAT

## 2020-05-31 ENCOUNTER — Ambulatory Visit: Payer: Managed Care, Other (non HMO) | Admitting: Gastroenterology

## 2020-07-01 ENCOUNTER — Other Ambulatory Visit: Payer: Self-pay

## 2020-07-01 ENCOUNTER — Ambulatory Visit (INDEPENDENT_AMBULATORY_CARE_PROVIDER_SITE_OTHER): Payer: Managed Care, Other (non HMO) | Admitting: Obstetrics and Gynecology

## 2020-07-01 ENCOUNTER — Encounter: Payer: Self-pay | Admitting: Obstetrics and Gynecology

## 2020-07-01 VITALS — BP 138/84 | HR 94 | Ht 61.0 in | Wt 208.1 lb

## 2020-07-01 DIAGNOSIS — T192XXA Foreign body in vulva and vagina, initial encounter: Secondary | ICD-10-CM | POA: Diagnosis not present

## 2020-07-01 NOTE — Progress Notes (Signed)
HPI:      Leah Baxter is a 24 y.o. No obstetric history on file. who LMP was Patient's last menstrual period was 06/29/2020.  Subjective:   She presents today with concerns that she may have a retained tampon.  She denies vaginal discharge or odor.  She does state that she is still having her menses. She has normal regular monthly menses. She is not currently using anything for birth control and has not become pregnant. She initially became concerned regarding retained tampon 2 days ago and felt like she had to get it checked out just to make sure.    Hx: The following portions of the patient's history were reviewed and updated as appropriate:             She  has a past medical history of Anxiety and Depression. She does not have any pertinent problems on file. She  has no past surgical history on file. Her family history includes Hyperlipidemia in her mother; Hypertension in her mother. She  reports that she has never smoked. She has never used smokeless tobacco. She reports current alcohol use. She reports that she does not use drugs. She has a current medication list which includes the following prescription(s): bupropion and tramadol. She is allergic to penicillins.       Review of Systems:  Review of Systems  Constitutional: Denied constitutional symptoms, night sweats, recent illness, fatigue, fever, insomnia and weight loss.  Eyes: Denied eye symptoms, eye pain, photophobia, vision change and visual disturbance.  Ears/Nose/Throat/Neck: Denied ear, nose, throat or neck symptoms, hearing loss, nasal discharge, sinus congestion and sore throat.  Cardiovascular: Denied cardiovascular symptoms, arrhythmia, chest pain/pressure, edema, exercise intolerance, orthopnea and palpitations.  Respiratory: Denied pulmonary symptoms, asthma, pleuritic pain, productive sputum, cough, dyspnea and wheezing.  Gastrointestinal: Denied, gastro-esophageal reflux, melena, nausea and vomiting.   Genitourinary: See HPI for additional information.  Musculoskeletal: Denied musculoskeletal symptoms, stiffness, swelling, muscle weakness and myalgia.  Dermatologic: Denied dermatology symptoms, rash and scar.  Neurologic: Denied neurology symptoms, dizziness, headache, neck pain and syncope.  Psychiatric: Denied psychiatric symptoms, anxiety and depression.  Endocrine: Denied endocrine symptoms including hot flashes and night sweats.   Meds:   Current Outpatient Medications on File Prior to Visit  Medication Sig Dispense Refill  . buPROPion (WELLBUTRIN XL) 300 MG 24 hr tablet TAKE 1 TABLET BY MOUTH EVERY DAY (Patient not taking: Reported on 07/01/2020) 90 tablet 1  . traMADol (ULTRAM) 50 MG tablet Take 1 tablet (50 mg total) by mouth every 8 (eight) hours as needed for moderate pain or severe pain. (Patient not taking: Reported on 07/01/2020) 20 tablet 0   No current facility-administered medications on file prior to visit.    Objective:     Vitals:   07/01/20 1443  BP: 138/84  Pulse: 94   Filed Weights   07/01/20 1443  Weight: 208 lb 1.6 oz (94.4 kg)              Physical examination   Pelvic:  Vulva: Normal appearance.  No lesions.  Vagina: No lesions or abnormalities noted.  Support: Normal pelvic support.  Urethra No masses tenderness or scarring.  Meatus Normal size without lesions or prolapse.  Cervix: Normal appearance.  No lesions.  Anus: Normal exam.  No lesions.  Perineum: Normal exam.  No lesions.     Assessment:    No obstetric history on file. Patient Active Problem List   Diagnosis Date Noted  . Diverticulitis 04/11/2020  .  Acute diverticulitis 04/10/2020  . Class 2 obesity due to excess calories without serious comorbidity with body mass index (BMI) of 36.0 to 36.9 in adult 08/02/2018  . Anxiety and depression 08/02/2018     1. Retained tampon, initial encounter     No evidence of retained tampon.   Plan:            1.  Reassured patient  regarding retained tampon.  We briefly discussed need for annual examination.  She plans to return for annual examination Pap smear and possible birth control discussion. Orders No orders of the defined types were placed in this encounter.   No orders of the defined types were placed in this encounter.     F/U  Return in about 1 month (around 07/31/2020) for Annual Physical. I spent 18 minutes involved in the care of this patient preparing to see the patient by obtaining and reviewing her medical history (including labs, imaging tests and prior procedures), documenting clinical information in the electronic health record (EHR), counseling and coordinating care plans, writing and sending prescriptions, ordering tests or procedures and directly communicating with the patient by discussing pertinent items from her history and physical exam as well as detailing my assessment and plan as noted above so that she has an informed understanding.  All of her questions were answered.  Elonda Husky, M.D. 07/01/2020 3:13 PM

## 2020-07-29 ENCOUNTER — Encounter: Payer: Managed Care, Other (non HMO) | Admitting: Obstetrics and Gynecology

## 2020-08-11 ENCOUNTER — Encounter: Payer: Managed Care, Other (non HMO) | Admitting: Obstetrics and Gynecology

## 2020-09-07 ENCOUNTER — Encounter: Payer: Managed Care, Other (non HMO) | Admitting: Obstetrics and Gynecology

## 2020-10-18 ENCOUNTER — Encounter: Payer: Self-pay | Admitting: Surgical

## 2020-10-19 ENCOUNTER — Encounter: Payer: Managed Care, Other (non HMO) | Admitting: Obstetrics and Gynecology

## 2021-02-11 ENCOUNTER — Other Ambulatory Visit: Payer: Self-pay

## 2021-02-11 ENCOUNTER — Emergency Department: Payer: Managed Care, Other (non HMO)

## 2021-02-11 ENCOUNTER — Emergency Department
Admission: EM | Admit: 2021-02-11 | Discharge: 2021-02-11 | Disposition: A | Discharge status: Home / Self Care | Payer: Managed Care, Other (non HMO) | Attending: Emergency Medicine | Admitting: Emergency Medicine

## 2021-02-11 DIAGNOSIS — K5732 Diverticulitis of large intestine without perforation or abscess without bleeding: Secondary | ICD-10-CM | POA: Insufficient documentation

## 2021-02-11 DIAGNOSIS — R1032 Left lower quadrant pain: Secondary | ICD-10-CM | POA: Diagnosis present

## 2021-02-11 DIAGNOSIS — K5792 Diverticulitis of intestine, part unspecified, without perforation or abscess without bleeding: Secondary | ICD-10-CM

## 2021-02-11 LAB — COMPREHENSIVE METABOLIC PANEL
ALT: 41 U/L (ref 0–44)
AST: 30 U/L (ref 15–41)
Albumin: 4.5 g/dL (ref 3.5–5.0)
Alkaline Phosphatase: 60 U/L (ref 38–126)
Anion gap: 11 (ref 5–15)
BUN: 10 mg/dL (ref 6–20)
CO2: 25 mmol/L (ref 22–32)
Calcium: 9.4 mg/dL (ref 8.9–10.3)
Chloride: 103 mmol/L (ref 98–111)
Creatinine, Ser: 0.72 mg/dL (ref 0.44–1.00)
GFR, Estimated: >60 mL/min (ref >60)
Glucose, Bld: 95 mg/dL (ref 70–99)
Potassium: 3.6 mmol/L (ref 3.5–5.1)
Sodium: 139 mmol/L (ref 135–145)
Total Bilirubin: 1.1 mg/dL (ref 0.3–1.2)
Total Protein: 7.9 g/dL (ref 6.5–8.1)

## 2021-02-11 LAB — LIPASE, BLOOD: Lipase: 27 U/L (ref 11–51)

## 2021-02-11 LAB — URINALYSIS, COMPLETE (UACMP) WITH MICROSCOPIC
Bilirubin Urine: NEGATIVE
Glucose, UA: NEGATIVE mg/dL
Hgb urine dipstick: NEGATIVE
Ketones, ur: NEGATIVE mg/dL
Leukocytes,Ua: NEGATIVE
Nitrite: NEGATIVE
Protein, ur: 30 mg/dL — AB
Specific Gravity, Urine: 1.024 (ref 1.005–1.030)
pH: 9 — ABNORMAL HIGH (ref 5.0–8.0)

## 2021-02-11 LAB — CBC
HCT: 45.4 % (ref 36.0–46.0)
Hemoglobin: 15.3 g/dL — ABNORMAL HIGH (ref 12.0–15.0)
MCH: 31.3 pg (ref 26.0–34.0)
MCHC: 33.7 g/dL (ref 30.0–36.0)
MCV: 92.8 fL (ref 80.0–100.0)
Platelets: 331 10*3/uL (ref 150–400)
RBC: 4.89 MIL/uL (ref 3.87–5.11)
RDW: 12 % (ref 11.5–15.5)
WBC: 14.8 10*3/uL — ABNORMAL HIGH (ref 4.0–10.5)
nRBC: 0 % (ref 0.0–0.2)

## 2021-02-11 LAB — POC URINE PREG, ED: Preg Test, Ur: NEGATIVE

## 2021-02-11 MED ORDER — LACTATED RINGERS IV BOLUS
1000.0000 mL | Freq: Once | INTRAVENOUS | Status: AC
  Administered 2021-02-11: 1000 mL via INTRAVENOUS

## 2021-02-11 MED ORDER — KETOROLAC TROMETHAMINE 30 MG/ML IJ SOLN
15.0000 mg | Freq: Once | INTRAMUSCULAR | Status: AC
  Administered 2021-02-11: 15 mg via INTRAVENOUS
  Filled 2021-02-11: qty 1

## 2021-02-11 MED ORDER — AMOXICILLIN-POT CLAVULANATE 875-125 MG PO TABS: 1.0000 | ORAL_TABLET | Freq: Two times a day (BID) | ORAL | 0 refills | Status: AC

## 2021-02-11 MED ORDER — ONDANSETRON HCL 4 MG/2ML IJ SOLN
4.0000 mg | Freq: Once | INTRAMUSCULAR | Status: AC
  Administered 2021-02-11: 4 mg via INTRAVENOUS
  Filled 2021-02-11: qty 2

## 2021-02-11 MED ORDER — AMOXICILLIN-POT CLAVULANATE 875-125 MG PO TABS
1.0000 | ORAL_TABLET | Freq: Once | ORAL | Status: AC
  Administered 2021-02-11: 1 via ORAL
  Filled 2021-02-11: qty 1

## 2021-02-11 NOTE — ED Provider Notes (Signed)
[facility] Emergency Department Provider Note ____________________________________________   Event Date/Time   First MD Initiated Contact with Patient 02/11/21 2050     (approximate)  I have reviewed the triage vital signs and the nursing notes.  HISTORY  Chief Complaint Abdominal Pain   HPI Leah Baxter is a 25 y.o. femalewho presents to the ED for evaluation of abd pain  Chart review indicates history of morbid obesity, depression and anxiety. History of IBS and admission 04/2020 for acute diverticulitis with microperforation.  Managed medically.  Patient presents to the ED due to concern for recurrent diverticulitis.  She reports developing LLQ abdominal pain and stool changes this evening, about 5 hours prior to arrival.  She reports this feels exactly like her diverticulitis in the past and reports concerned that it feels little bit worse with sharp pain up to 8/10 intensity.  Denies upper abdominal pain, emesis, dysuria, hematuria, vaginal discharge or bleeding.  Denies fevers, falls or syncope.   Past Medical History:  Diagnosis Date  . Anxiety   . Depression     Patient Active Problem List   Diagnosis Date Noted  . Diverticulitis 04/11/2020  . Acute diverticulitis 04/10/2020  . Class 2 obesity due to excess calories without serious comorbidity with body mass index (BMI) of 36.0 to 36.9 in adult 08/02/2018  . Anxiety and depression 08/02/2018    No past surgical history on file.  Prior to Admission medications   Medication Sig Start Date End Date Taking? Authorizing Provider  amoxicillin-clavulanate (AUGMENTIN) 875-125 MG tablet Take 1 tablet by mouth every 12 (twelve) hours for 5 days. 02/11/21 02/16/21 Yes Delton Prairie, MD  buPROPion (WELLBUTRIN XL) 300 MG 24 hr tablet TAKE 1 TABLET BY MOUTH EVERY DAY Patient not taking: Reported on 07/01/2020 01/05/20   Theadore Nan, NP  traMADol (ULTRAM) 50 MG tablet Take 1 tablet (50 mg total) by  mouth every 8 (eight) hours as needed for moderate pain or severe pain. Patient not taking: Reported on 07/01/2020 04/12/20   Enedina Finner, MD    Allergies Penicillins  Family History  Problem Relation Age of Onset  . Hyperlipidemia Mother   . Hypertension Mother     Social History Social History   Tobacco Use  . Smoking status: Never Smoker  . Smokeless tobacco: Never Used  Vaping Use  . Vaping Use: Never used  Substance Use Topics  . Alcohol use: Yes  . Drug use: Never    Review of Systems  Constitutional: No fever/chills Eyes: No visual changes. ENT: No sore throat. Cardiovascular: Denies chest pain. Respiratory: Denies shortness of breath. Gastrointestinal: Positive for abdominal pain and diarrhea  no vomiting.   Genitourinary: Negative for dysuria. Musculoskeletal: Negative for back pain. Skin: Negative for rash. Neurological: Negative for headaches, focal weakness or numbness.  ____________________________________________   PHYSICAL EXAM:  VITAL SIGNS: Vitals:   02/11/21 1826 02/11/21 2139  BP: (!) 141/99 (!) 146/101  Pulse: (!) 116 90  Resp: 18 14  Temp: 98.2 F (36.8 C)   SpO2: 98% 99%     Constitutional: Alert and oriented.  Obese and appears uncomfortable, but in no acute distress. Eyes: Conjunctivae are normal. PERRL. EOMI. Head: Atraumatic. Nose: No congestion/rhinnorhea. Mouth/Throat: Mucous membranes are moist.  Oropharynx non-erythematous. Neck: No stridor. No cervical spine tenderness to palpation. Cardiovascular: Tachycardic rate, regular rhythm. Grossly normal heart sounds.  Good peripheral circulation. Respiratory: Normal respiratory effort.  No retractions. Lungs CTAB. Gastrointestinal: Soft , nondistended,. No CVA tenderness LLQ  tenderness with voluntary guarding.  Benign upper abdomen. Musculoskeletal: No lower extremity tenderness nor edema.  No joint effusions. No signs of acute trauma. Neurologic:  Normal speech and language. No  gross focal neurologic deficits are appreciated. No gait instability noted. Skin:  Skin is warm, dry and intact. No rash noted. Psychiatric: Mood and affect are normal. Speech and behavior are normal.  ____________________________________________   LABS (all labs ordered are listed, but only abnormal results are displayed)  Labs Reviewed  CBC - Abnormal; Notable for the following components:      Result Value   WBC 14.8 (*)    Hemoglobin 15.3 (*)    All other components within normal limits  URINALYSIS, COMPLETE (UACMP) WITH MICROSCOPIC - Abnormal; Notable for the following components:   Color, Urine YELLOW (*)    APPearance HAZY (*)    pH 9.0 (*)    Protein, ur 30 (*)    Bacteria, UA RARE (*)    All other components within normal limits  LIPASE, BLOOD  COMPREHENSIVE METABOLIC PANEL  POC URINE PREG, ED     ____________________________________________  RADIOLOGY  ED MD interpretation:    Official radiology report(s): CT ABDOMEN PELVIS WO CONTRAST  Result Date: 02/11/2021 CLINICAL DATA:  Diverticulitis suspected long hx divertic, microperforation last year, recurrent LLQ pain/TTP today. leukocytosis. eval EXAM: CT ABDOMEN AND PELVIS WITHOUT CONTRAST TECHNIQUE: Multidetector CT imaging of the abdomen and pelvis was performed following the standard protocol without IV contrast. COMPARISON:  CT abdomen pelvis 04/10/2020 FINDINGS: Lower chest: No acute abnormality. Hepatobiliary: The hepatic parenchyma is diffusely hypodense compared to the splenic parenchyma consistent with fatty infiltration. No focal liver abnormality. No gallstones, gallbladder wall thickening, or pericholecystic fluid. No biliary dilatation. Pancreas: No focal lesion. Normal pancreatic contour. No surrounding inflammatory changes. No main pancreatic ductal dilatation. Spleen: Normal in size without focal abnormality. Adrenals/Urinary Tract: No adrenal nodule bilaterally. No nephrolithiasis, no hydronephrosis, and  no contour-deforming renal mass. No ureterolithiasis or hydroureter. The urinary bladder is unremarkable. Stomach/Bowel: Stomach is within normal limits. No evidence of small bowel wall thickening or dilatation. Scattered colonic diverticulosis with bowel wall thickening of the distal descending colon and associated pericolonic fat stranding. Appendix appears normal. Vascular/Lymphatic: No significant vascular findings are present. No enlarged abdominal or pelvic lymph nodes. Reproductive: Uterus and bilateral adnexa are unremarkable. Other: No intraperitoneal free gas. No organized fluid collection. Musculoskeletal: No acute or significant osseous findings. IMPRESSION: 1. Scattered colonic diverticulosis with distal descending colon diverticulitis. Associated trace pericolic free fluid with no definite free intraperitoneal gas to confirm a microperforation. No intramural abscess formation. 2. Hepatic steatosis. Electronically Signed   By: Tish Frederickson M.D.   On: 02/11/2021 22:02    ____________________________________________   PROCEDURES and INTERVENTIONS  Procedure(s) performed (including Critical Care):  .1-3 Lead EKG Interpretation Performed by: Delton Prairie, MD Authorized by: Delton Prairie, MD     Interpretation: abnormal     ECG rate:  104   ECG rate assessment: tachycardic     Rhythm: sinus tachycardia     Ectopy: none     Conduction: normal      Medications  amoxicillin-clavulanate (AUGMENTIN) 875-125 MG per tablet 1 tablet (has no administration in time range)  lactated ringers bolus 1,000 mL (1,000 mLs Intravenous New Bag/Given 02/11/21 2132)  ondansetron (ZOFRAN) injection 4 mg (4 mg Intravenous Given 02/11/21 2128)  ketorolac (TORADOL) 30 MG/ML injection 15 mg (15 mg Intravenous Given 02/11/21 2130)    ____________________________________________   MDM / ED  COURSE Obese 25 year old woman with a history of diverticulitis presents to the ED with symptoms of the same, and  amenable to outpatient management.  Presents tachycardic, improving with IVF and Toradol.  Exam with LLQ tenderness without peritoneal features, otherwise unremarkable.  Blood work with mild leukocytosis and otherwise unremarkable.  CT imaging obtained and demonstrates diverticulitis without evidence of abscess, biliary pathology, or urologic obstruction.  UA without evidence of infection.  Due to her history of microperforation, and noncontrasted study, we will discharge with a short course of Augmentin and with return precautions.  Clinical Course as of 02/11/21 2247  Fri Feb 11, 2021  2242 Reassessed.  Patient ports feeling better.  Boyfriend at the bedside and her tachycardia has resolved.  We discussed diverticulitis and short course of Augmentin antibiotics.  We discussed return precautions for the ED.  We discussed fiber and probiotic supplementation.   [DS]    Clinical Course User Index [DS] Delton Prairie, MD    ____________________________________________   FINAL CLINICAL IMPRESSION(S) / ED DIAGNOSES  Final diagnoses:  Diverticulitis  Left lower quadrant abdominal pain     ED Discharge Orders         Ordered    amoxicillin-clavulanate (AUGMENTIN) 875-125 MG tablet  Every 12 hours        02/11/21 2244           Hildred Mollica   Note:  This document was prepared using Dragon voice recognition software and may include unintentional dictation errors.   Delton Prairie, MD 02/11/21 2248

## 2021-02-11 NOTE — ED Triage Notes (Signed)
Pt states that she is having left lower abd pain and states that she thinks she may be having a diverticulitis flare, states similar pain a year ago when she was diagnosed

## 2021-02-11 NOTE — Discharge Instructions (Addendum)
As discussed, it really would help to use fiber and probiotic supplementation.  You are being discharged with a prescription for Augmentin antibiotics to take twice daily for the next 5 days.  Use naproxen/Aleve for anti-inflammatory pain relief. Use up to 500mg  every 12 hours. Do not take more frequently than this. Do not use other NSAIDs (ibuprofen, Advil) while taking this medication. It is safe to take Tylenol with this.   Return to the ED with any further worsening symptoms despite these measures.

## 2021-09-21 ENCOUNTER — Other Ambulatory Visit: Payer: Self-pay

## 2021-09-21 ENCOUNTER — Emergency Department
Admission: EM | Admit: 2021-09-21 | Discharge: 2021-09-22 | Disposition: A | Discharge status: Home / Self Care | Payer: Managed Care, Other (non HMO) | Attending: Emergency Medicine | Admitting: Emergency Medicine

## 2021-09-21 DIAGNOSIS — R1012 Left upper quadrant pain: Secondary | ICD-10-CM | POA: Diagnosis present

## 2021-09-21 DIAGNOSIS — K5732 Diverticulitis of large intestine without perforation or abscess without bleeding: Secondary | ICD-10-CM | POA: Diagnosis not present

## 2021-09-21 DIAGNOSIS — K5792 Diverticulitis of intestine, part unspecified, without perforation or abscess without bleeding: Secondary | ICD-10-CM

## 2021-09-21 LAB — COMPREHENSIVE METABOLIC PANEL
ALT: 74 U/L — ABNORMAL HIGH (ref 0–44)
AST: 48 U/L — ABNORMAL HIGH (ref 15–41)
Albumin: 4.5 g/dL (ref 3.5–5.0)
Alkaline Phosphatase: 65 U/L (ref 38–126)
Anion gap: 8 (ref 5–15)
BUN: 12 mg/dL (ref 6–20)
CO2: 27 mmol/L (ref 22–32)
Calcium: 9.8 mg/dL (ref 8.9–10.3)
Chloride: 101 mmol/L (ref 98–111)
Creatinine, Ser: 0.71 mg/dL (ref 0.44–1.00)
GFR, Estimated: >60 mL/min (ref >60)
Glucose, Bld: 91 mg/dL (ref 70–99)
Potassium: 4.2 mmol/L (ref 3.5–5.1)
Sodium: 136 mmol/L (ref 135–145)
Total Bilirubin: 1 mg/dL (ref 0.3–1.2)
Total Protein: 7.9 g/dL (ref 6.5–8.1)

## 2021-09-21 LAB — URINALYSIS, ROUTINE W REFLEX MICROSCOPIC
Bilirubin Urine: NEGATIVE
Glucose, UA: NEGATIVE mg/dL
Hgb urine dipstick: NEGATIVE
Ketones, ur: NEGATIVE mg/dL
Leukocytes,Ua: NEGATIVE
Nitrite: NEGATIVE
Protein, ur: NEGATIVE mg/dL
Specific Gravity, Urine: >1.03 — ABNORMAL HIGH (ref 1.005–1.030)
pH: 5.5 (ref 5.0–8.0)

## 2021-09-21 LAB — CBC
HCT: 43.6 % (ref 36.0–46.0)
Hemoglobin: 14.8 g/dL (ref 12.0–15.0)
MCH: 32.5 pg (ref 26.0–34.0)
MCHC: 33.9 g/dL (ref 30.0–36.0)
MCV: 95.6 fL (ref 80.0–100.0)
Platelets: 303 10*3/uL (ref 150–400)
RBC: 4.56 MIL/uL (ref 3.87–5.11)
RDW: 11.9 % (ref 11.5–15.5)
WBC: 11.5 10*3/uL — ABNORMAL HIGH (ref 4.0–10.5)
nRBC: 0 % (ref 0.0–0.2)

## 2021-09-21 LAB — LIPASE, BLOOD: Lipase: 30 U/L (ref 11–51)

## 2021-09-21 LAB — PREGNANCY, URINE: Preg Test, Ur: NEGATIVE

## 2021-09-21 NOTE — ED Triage Notes (Signed)
Pt via POV reporting LUQ abdominal pain consistent with prior diverticulitis flares. She says she typically has a flare about every 4-6 months. Pt has had some nausea w/o vomiting and reports some diarrhea also. Hx IBS as well as diverticulitis.

## 2021-09-21 NOTE — ED Provider Notes (Signed)
[facility] Emergency Department Provider Note  ____________________________________________   Event Date/Time   First MD Initiated Contact with Patient 09/21/21 2355     (approximate)  I have reviewed the triage vital signs and the nursing notes.   HISTORY  Chief Complaint Abdominal Pain   HPI Leah Baxter is a 25 y.o. female with below noted past medical history who presents for assessment of left upper quadrant abdominal pain associate with nausea and diarrhea in the setting of some chronic diarrhea from IBS.  Patient is not on any daily medications for this..  Patient states she feels very similar to prior episodes of diverticulitis she has had in the past.  It seems she started feeling bad today.  She denies any chest pain, shortness of breath, cough, fevers, headache, earache, sore throat, vomiting, back pain, rash or extremity pain.  Denies any urinary symptoms.  Patient has a history of ulcers denies significant NSAID use or EtOH use.  No other acute concerns at this time.         Past Medical History:  Diagnosis Date   Anxiety    Depression     Patient Active Problem List   Diagnosis Date Noted   Diverticulitis 04/11/2020   Acute diverticulitis 04/10/2020   Class 2 obesity due to excess calories without serious comorbidity with body mass index (BMI) of 36.0 to 36.9 in adult 08/02/2018   Anxiety and depression 08/02/2018    No past surgical history on file.  Prior to Admission medications   Medication Sig Start Date End Date Taking? Authorizing Provider  amoxicillin-clavulanate (AUGMENTIN) 875-125 MG tablet Take 1 tablet by mouth 2 (two) times daily for 10 days. 09/22/21 10/02/21 Yes Gilles Chiquito, MD  HYDROcodone-acetaminophen (NORCO) 5-325 MG tablet Take 1 tablet by mouth every 6 (six) hours as needed for up to 3 days for severe pain. 09/22/21 09/25/21 Yes Gilles Chiquito, MD  ondansetron (ZOFRAN) 4 MG tablet Take 1 tablet (4 mg  total) by mouth every 8 (eight) hours as needed for up to 10 doses for nausea or vomiting. 09/22/21  Yes Gilles Chiquito, MD    Allergies Penicillins  Family History  Problem Relation Age of Onset   Hyperlipidemia Mother    Hypertension Mother     Social History Social History   Tobacco Use   Smoking status: Never   Smokeless tobacco: Never  Vaping Use   Vaping Use: Never used  Substance Use Topics   Alcohol use: Yes   Drug use: Never    Review of Systems  Review of Systems  Constitutional:  Negative for chills and fever.  HENT:  Negative for sore throat.   Eyes:  Negative for pain.  Respiratory:  Negative for cough and stridor.   Cardiovascular:  Negative for chest pain.  Gastrointestinal:  Positive for abdominal pain, diarrhea and nausea. Negative for vomiting.  Genitourinary:  Negative for dysuria.  Musculoskeletal:  Negative for myalgias.  Skin:  Negative for rash.  Neurological:  Negative for seizures, loss of consciousness and headaches.  Psychiatric/Behavioral:  Negative for suicidal ideas.   All other systems reviewed and are negative.    ____________________________________________   PHYSICAL EXAM:  VITAL SIGNS: ED Triage Vitals  Enc Vitals Group     BP 09/21/21 1743 (!) 148/108     Pulse Rate 09/21/21 1743 (!) 104     Resp 09/21/21 1743 17     Temp 09/21/21 1743 99.1 F (37.3 C)  Temp Source 09/21/21 1743 Oral     SpO2 09/21/21 1743 100 %     Weight 09/21/21 1744 215 lb (97.5 kg)     Height 09/21/21 1744 5\' 1"  (1.549 m)     Head Circumference --      Peak Flow --      Pain Score 09/21/21 1802 3     Pain Loc --      Pain Edu? --      Excl. in [place]? --    Vitals:   09/21/21 1928 09/22/21 0028  BP: (!) 154/100 (!) 146/89  Pulse: 99 93  Resp: 18 18  Temp: 98.9 F (37.2 C)   SpO2: 99% 98%   Physical Exam Vitals and nursing note reviewed.  Constitutional:      General: She is not in acute distress.    Appearance: She is  well-developed.  HENT:     Head: Normocephalic and atraumatic.     Right Ear: External ear normal.     Left Ear: External ear normal.     Nose: Nose normal.  Eyes:     Conjunctiva/sclera: Conjunctivae normal.  Cardiovascular:     Rate and Rhythm: Normal rate and regular rhythm.     Heart sounds: No murmur heard. Pulmonary:     Effort: Pulmonary effort is normal. No respiratory distress.     Breath sounds: Normal breath sounds.  Abdominal:     Palpations: Abdomen is soft.     Tenderness: There is abdominal tenderness in the left upper quadrant and left lower quadrant. There is no right CVA tenderness or left CVA tenderness.  Musculoskeletal:        General: No swelling.     Cervical back: Neck supple.  Skin:    General: Skin is warm and dry.     Capillary Refill: Capillary refill takes less than 2 seconds.  Neurological:     Mental Status: She is alert and oriented to person, place, and time.  Psychiatric:        Mood and Affect: Mood normal.     ____________________________________________   LABS (all labs ordered are listed, but only abnormal results are displayed)  Labs Reviewed  COMPREHENSIVE METABOLIC PANEL - Abnormal; Notable for the following components:      Result Value   AST 48 (*)    ALT 74 (*)    All other components within normal limits  CBC - Abnormal; Notable for the following components:   WBC 11.5 (*)    All other components within normal limits  URINALYSIS, ROUTINE W REFLEX MICROSCOPIC - Abnormal; Notable for the following components:   APPearance CLEAR (*)    Specific Gravity, Urine >1.030 (*)    Bacteria, UA RARE (*)    All other components within normal limits  LIPASE, BLOOD  PREGNANCY, URINE   ____________________________________________  EKG  ____________________________________________  RADIOLOGY  ED MD interpretation: CT abdomen pelvis shows mild acute uncomplicated proximal descending colon diverticulitis.  There is no perforation or  abscess.  There are some hepatic steatosis and no evidence of cholecystitis, pancreatitis, myositis, kidney stone or other acute abdominal pelvic process.  Adnexa are unremarkable.  Official radiology report(s): CT ABDOMEN PELVIS W CONTRAST  Result Date: 09/22/2021 CLINICAL DATA:  Left upper quadrant pain. EXAM: CT ABDOMEN AND PELVIS WITH CONTRAST TECHNIQUE: Multidetector CT imaging of the abdomen and pelvis was performed using the standard protocol following bolus administration of intravenous contrast. CONTRAST:  170mL OMNIPAQUE IOHEXOL 300 MG/ML  SOLN COMPARISON:  Feb 11, 2021 FINDINGS: Lower chest: No acute abnormality. Hepatobiliary: There is diffuse fatty infiltration of the liver parenchyma. No focal liver abnormality is seen. No gallstones, gallbladder wall thickening, or biliary dilatation. Pancreas: Unremarkable. No pancreatic ductal dilatation or surrounding inflammatory changes. Spleen: Normal in size without focal abnormality. Adrenals/Urinary Tract: Adrenal glands are unremarkable. Kidneys are normal, without renal calculi, focal lesion, or hydronephrosis. Bladder is unremarkable. Stomach/Bowel: Stomach is within normal limits. Appendix appears normal. No evidence of bowel wall thickening, distention, or inflammatory changes. Mildly inflamed diverticula are seen within the proximal descending colon. There is no evidence of associated perforation or abscess. Noninflamed diverticula are noted within the sigmoid colon. Vascular/Lymphatic: No significant vascular findings are present. No enlarged abdominal or pelvic lymph nodes. Reproductive: Uterus and bilateral adnexa are unremarkable. Other: No abdominal wall hernia or abnormality. No abdominopelvic ascites. Musculoskeletal: No acute or significant osseous findings. IMPRESSION: 1. Mild diverticulitis involving the proximal descending colon without evidence of associated perforation or abscess. 2. Hepatic steatosis. Electronically Signed   By:  Virgina Norfolk M.D.   On: 09/22/2021 00:57    ____________________________________________   PROCEDURES  Procedure(s) performed (including Critical Care):  Procedures   ____________________________________________   INITIAL IMPRESSION / ASSESSMENT AND PLAN / ED COURSE       Patient presents with above-stated history and exam for assessment of approximately 1 day of some left-sided abdominal discomfort associate with nausea and diarrhea.  On arrival patient is hypertensive with otherwise stable vital signs on room air.  She does have some mild left abdominal discomfort but is not peritoneal guarding and is no significant right-sided discomfort or tenderness on exam.  Differential considerations include recurrent diverticulitis, kidney stone, pyelonephritis, gastritis, peptic ulcer disease, infectious gastroenteritis, torsion, pregnancy and cystitis.  CMP shows LFTs just above lower limit of normal without any other significant electrolyte or metabolic derangements.  This is not consistent with acute hepatitis and given absence of tenderness in the right upper quadrant of the very low suspicion for cholecystitis.  Lipase is not consistent with pancreatitis.  CBC shows WBC count of 11.5 without evidence of acute anemia and normal platelets.  Pregnancy test is negative.  UA has some rare bacteria but otherwise not suggestive of cystitis or urinary source of infection.  Given absence of fever or CVA tenderness.  Very low suspicion for pyelonephritis.  CT abdomen pelvis shows mild acute uncomplicated proximal descending colon diverticulitis.  There is no perforation or abscess.  There are some hepatic steatosis and no evidence of cholecystitis, pancreatitis, myositis, kidney stone or other acute abdominal pelvic process.  Adnexa are unremarkable.  Patient states she is feeling much better after some Toradol on reassessment.  Her nausea are well controlled.  Advised of elevated blood pressure  and recommended to have this rechecked by PCP.  She will also follow-up with her GI doctor for diverticulitis.  Rx written for antibiotics analgesia and antiemetics.  Discharged in stable condition.  Strict return precautions advised and discussed      ____________________________________________   FINAL CLINICAL IMPRESSION(S) / ED DIAGNOSES  Final diagnoses:  Diverticulitis    Medications  ketorolac (TORADOL) 30 MG/ML injection 15 mg (15 mg Intravenous Given 09/22/21 0023)  ondansetron (ZOFRAN) injection 4 mg (4 mg Intravenous Given 09/22/21 0023)  iohexol (OMNIPAQUE) 300 MG/ML solution 100 mL (100 mLs Intravenous Contrast Given 09/22/21 0045)     ED Discharge Orders          Ordered    amoxicillin-clavulanate (AUGMENTIN) 875-125 MG  tablet  2 times daily        09/22/21 0102    ondansetron (ZOFRAN) 4 MG tablet  Every 8 hours PRN        09/22/21 0102    HYDROcodone-acetaminophen (NORCO) 5-325 MG tablet  Every 6 hours PRN        09/22/21 0102             Note:  This document was prepared using Dragon voice recognition software and may include unintentional dictation errors.    Lucrezia Starch, MD 09/22/21 [phone]

## 2021-09-22 ENCOUNTER — Encounter: Payer: Self-pay | Admitting: Radiology

## 2021-09-22 ENCOUNTER — Emergency Department: Payer: Managed Care, Other (non HMO)

## 2021-09-22 MED ORDER — AMOXICILLIN-POT CLAVULANATE 875-125 MG PO TABS: 1.0000 | ORAL_TABLET | Freq: Two times a day (BID) | ORAL | 0 refills | Status: AC

## 2021-09-22 MED ORDER — HYDROCODONE-ACETAMINOPHEN 5-325 MG PO TABS: 1.0000 | ORAL_TABLET | Freq: Four times a day (QID) | ORAL | 0 refills | Status: AC | PRN

## 2021-09-22 MED ORDER — KETOROLAC TROMETHAMINE 30 MG/ML IJ SOLN
15.0000 mg | Freq: Once | INTRAMUSCULAR | Status: AC
  Administered 2021-09-22: 15 mg via INTRAVENOUS
  Filled 2021-09-22: qty 1

## 2021-09-22 MED ORDER — ONDANSETRON HCL 4 MG PO TABS: 4.0000 mg | ORAL_TABLET | Freq: Three times a day (TID) | ORAL | 0 refills | Status: DC | PRN

## 2021-09-22 MED ORDER — IOHEXOL 300 MG/ML  SOLN
100.0000 mL | Freq: Once | INTRAMUSCULAR | Status: AC | PRN
  Administered 2021-09-22: 01:00:00 100 mL via INTRAVENOUS

## 2021-09-22 MED ORDER — ONDANSETRON HCL 4 MG/2ML IJ SOLN
4.0000 mg | Freq: Once | INTRAMUSCULAR | Status: AC
  Administered 2021-09-22: 4 mg via INTRAVENOUS
  Filled 2021-09-22: qty 2

## 2021-09-22 NOTE — ED Notes (Signed)
E-signature pad unavailable - Pt verbalized understanding of D/C information - no additional concerns at this time.  

## 2022-02-28 ENCOUNTER — Encounter: Payer: Self-pay | Admitting: Gastroenterology

## 2022-02-28 ENCOUNTER — Ambulatory Visit: Payer: Managed Care, Other (non HMO) | Admitting: Gastroenterology

## 2022-02-28 VITALS — BP 138/88 | HR 94 | Temp 98.7°F | Wt 207.0 lb

## 2022-02-28 DIAGNOSIS — K5792 Diverticulitis of intestine, part unspecified, without perforation or abscess without bleeding: Secondary | ICD-10-CM

## 2022-02-28 MED ORDER — NA SULFATE-K SULFATE-MG SULF 17.5-3.13-1.6 GM/177ML PO SOLN: 1.0000 | Freq: Once | ORAL | 0 refills | Status: AC

## 2022-02-28 NOTE — Progress Notes (Signed)
Primary Care Physician: Pcp, No  Primary Gastroenterologist:  Dr. Lucilla Lame  Chief Complaint  Patient presents with   Diverticulitis    Pt reports she is having flares every 2-3 mths... Wanted to discuss how to manage    HPI: Leah Baxter is a 26 y.o. female here with a history of diverticulitis.  The patient was in the emergency department in December of last and a CT scan showed:  IMPRESSION: 1. Mild diverticulitis involving the proximal descending colon without evidence of associated perforation or abscess. 2. Hepatic steatosis.  The patient reports that she is feeling well now without any symptoms.  She also reports that she has had a change in her diet trying to avoid recurrent diverticulitis but it does not seem to help.  The patient reports that she did not follow-up with the previous recommended colonoscopy.  Past Medical History:  Diagnosis Date   Anxiety    Depression     Current Outpatient Medications  Medication Sig Dispense Refill   buPROPion ER (WELLBUTRIN SR) 100 MG 12 hr tablet Take 100 mg by mouth daily.     phentermine (ADIPEX-P) 37.5 MG tablet Take 37.5 mg by mouth daily.     Na Sulfate-K Sulfate-Mg Sulf 17.5-3.13-1.6 GM/177ML SOLN Take 1 kit by mouth once for 1 dose. 354 mL 0   No current facility-administered medications for this visit.    Allergies as of 02/28/2022 - Review Complete 02/28/2022  Allergen Reaction Noted   Penicillins Hives 08/01/2018    ROS:  General: Negative for anorexia, weight loss, fever, chills, fatigue, weakness. ENT: Negative for hoarseness, difficulty swallowing , nasal congestion. CV: Negative for chest pain, angina, palpitations, dyspnea on exertion, peripheral edema.  Respiratory: Negative for dyspnea at rest, dyspnea on exertion, cough, sputum, wheezing.  GI: See history of present illness. GU:  Negative for dysuria, hematuria, urinary incontinence, urinary frequency, nocturnal urination.  Endo: Negative for  unusual weight change.    Physical Examination:   BP 138/88   Pulse 94   Temp 98.7 F (37.1 C) (Oral)   Wt 207 lb (93.9 kg)   BMI 39.11 kg/m   General: Well-nourished, well-developed in no acute distress.  Eyes: No icterus. Conjunctivae pink. Lungs: Clear to auscultation bilaterally. Non-labored. Heart: Regular rate and rhythm, no murmurs rubs or gallops.  Abdomen: Bowel sounds are normal, nontender, nondistended, no hepatosplenomegaly or masses, no abdominal bruits or hernia , no rebound or guarding.   Extremities: No lower extremity edema. No clubbing or deformities. Neuro: Alert and oriented x 3.  Grossly intact. Skin: Warm and dry, no jaundice.   Psych: Alert and cooperative, normal mood and affect.  Labs:   Imaging Studies: No results found.  Assessment and Plan:   Leah Baxter is a 26 y.o. y/o female who comes in today with a history of recurrent diverticulitis.  The patient has been spoken to about the need for colonoscopy since she has never had a colonoscopy.  The patient has been told that it also will tell us the extent of her diverticulosis thereby letting us know what her options are if she continues to have recurrence and at what location of the colon the diverticuli reside.  The patient has been explained the plan and will be set up for colonoscopy.  The patient has been explained the plan and agrees with it.     Lucilla Lame, MD. Marval Regal    Note: This dictation was prepared with Dragon dictation along with smaller phrase technology.  Any transcriptional errors that result from this process are unintentional.

## 2022-03-09 ENCOUNTER — Encounter: Payer: Self-pay | Admitting: Gastroenterology

## 2022-03-10 NOTE — Anesthesia Preprocedure Evaluation (Addendum)
Anesthesia Evaluation  Patient identified by MRN, date of birth, ID band Patient awake    Reviewed: Allergy & Precautions, NPO status , Patient's Chart, lab work & pertinent test results, reviewed documented beta blocker date and time   History of Anesthesia Complications Negative for: history of anesthetic complications  Airway Mallampati: III  TM Distance: >3 FB Neck ROM: Full    Dental   Pulmonary    breath sounds clear to auscultation       Cardiovascular (-) angina(-) DOE  Rhythm:Regular Rate:Normal     Neuro/Psych PSYCHIATRIC DISORDERS Anxiety Depression    GI/Hepatic neg GERD  ,Diverticulosis   Endo/Other    Renal/GU      Musculoskeletal   Abdominal (+) + obese (BMI 39),   Peds  Hematology   Anesthesia Other Findings   Reproductive/Obstetrics                            Anesthesia Physical Anesthesia Plan  ASA: 2  Anesthesia Plan: General   Post-op Pain Management:    Induction: Intravenous  PONV Risk Score and Plan: 3 and Propofol infusion, TIVA and Treatment may vary due to age or medical condition  Airway Management Planned: Natural Airway and Nasal Cannula  Additional Equipment:   Intra-op Plan:   Post-operative Plan:   Informed Consent: I have reviewed the patients History and Physical, chart, labs and discussed the procedure including the risks, benefits and alternatives for the proposed anesthesia with the patient or authorized representative who has indicated his/her understanding and acceptance.       Plan Discussed with: CRNA and Anesthesiologist  Anesthesia Plan Comments:         Anesthesia Quick Evaluation

## 2022-03-20 ENCOUNTER — Ambulatory Visit: Payer: Managed Care, Other (non HMO) | Admitting: Anesthesiology

## 2022-03-20 ENCOUNTER — Other Ambulatory Visit: Payer: Self-pay

## 2022-03-20 ENCOUNTER — Encounter
Admission: RE | Disposition: A | Discharge status: Home / Self Care | Payer: Self-pay | Source: Home / Self Care | Attending: Gastroenterology

## 2022-03-20 ENCOUNTER — Ambulatory Visit
Admission: RE | Admit: 2022-03-20 | Discharge: 2022-03-20 | Disposition: A | Discharge status: Home / Self Care | Payer: Managed Care, Other (non HMO) | Attending: Gastroenterology | Admitting: Gastroenterology

## 2022-03-20 ENCOUNTER — Encounter: Payer: Self-pay | Admitting: Gastroenterology

## 2022-03-20 DIAGNOSIS — Z6839 Body mass index (BMI) 39.0-39.9, adult: Secondary | ICD-10-CM | POA: Insufficient documentation

## 2022-03-20 DIAGNOSIS — K640 First degree hemorrhoids: Secondary | ICD-10-CM | POA: Diagnosis not present

## 2022-03-20 DIAGNOSIS — E669 Obesity, unspecified: Secondary | ICD-10-CM | POA: Diagnosis not present

## 2022-03-20 DIAGNOSIS — K5730 Diverticulosis of large intestine without perforation or abscess without bleeding: Secondary | ICD-10-CM | POA: Diagnosis not present

## 2022-03-20 DIAGNOSIS — K5792 Diverticulitis of intestine, part unspecified, without perforation or abscess without bleeding: Secondary | ICD-10-CM

## 2022-03-20 DIAGNOSIS — F419 Anxiety disorder, unspecified: Secondary | ICD-10-CM | POA: Diagnosis not present

## 2022-03-20 DIAGNOSIS — F32A Depression, unspecified: Secondary | ICD-10-CM | POA: Diagnosis not present

## 2022-03-20 DIAGNOSIS — K5732 Diverticulitis of large intestine without perforation or abscess without bleeding: Secondary | ICD-10-CM | POA: Diagnosis present

## 2022-03-20 HISTORY — PX: COLONOSCOPY: SHX5424

## 2022-03-20 LAB — POCT PREGNANCY, URINE: Preg Test, Ur: NEGATIVE

## 2022-03-20 SURGERY — COLONOSCOPY
Anesthesia: General

## 2022-03-20 MED ORDER — STERILE WATER FOR IRRIGATION IR SOLN
Status: DC | PRN
  Administered 2022-03-20: 500 mL

## 2022-03-20 MED ORDER — ACETAMINOPHEN 325 MG PO TABS: 650.0000 mg | ORAL_TABLET | ORAL | Status: DC | PRN

## 2022-03-20 MED ORDER — PROPOFOL 10 MG/ML IV BOLUS
INTRAVENOUS | Status: DC | PRN
  Administered 2022-03-20: 30 mg via INTRAVENOUS
  Administered 2022-03-20: 80 mg via INTRAVENOUS
  Administered 2022-03-20: 20 mg via INTRAVENOUS
  Administered 2022-03-20 (×3): 40 mg via INTRAVENOUS
  Administered 2022-03-20: 20 mg via INTRAVENOUS
  Administered 2022-03-20: 30 mg via INTRAVENOUS

## 2022-03-20 MED ORDER — ACETAMINOPHEN 160 MG/5ML PO SOLN: 325.0000 mg | ORAL | Status: DC | PRN

## 2022-03-20 MED ORDER — ONDANSETRON HCL 4 MG/2ML IJ SOLN: 4.0000 mg | Freq: Once | INTRAMUSCULAR | Status: DC | PRN

## 2022-03-20 MED ORDER — LIDOCAINE HCL (CARDIAC) PF 100 MG/5ML IV SOSY
PREFILLED_SYRINGE | INTRAVENOUS | Status: DC | PRN
  Administered 2022-03-20: 40 mg via INTRAVENOUS

## 2022-03-20 MED ORDER — SODIUM CHLORIDE 0.9 % IV SOLN: INTRAVENOUS | Status: DC

## 2022-03-20 MED ORDER — LACTATED RINGERS IV SOLN: INTRAVENOUS | Status: DC

## 2022-03-20 SURGICAL SUPPLY — 22 items

## 2022-03-20 NOTE — H&P (Signed)
   Midge Minium, MD [facility] [address], [address] [place], [place] [address] [phone] Fax : [phone]  Primary Care Physician:  Pcp, No Primary Gastroenterologist:  Dr. Servando Snare  Pre-Procedure History & Physical: HPI:  Leah Baxter is a 26 y.o. female is here for an colonoscopy.   Past Medical History:  Diagnosis Date   Anxiety    Depression     History reviewed. No pertinent surgical history.  Prior to Admission medications   Medication Sig Start Date End Date Taking? Authorizing Provider  buPROPion ER (WELLBUTRIN SR) 100 MG 12 hr tablet Take 100 mg by mouth daily. 01/03/22  Yes [provider]  phentermine (ADIPEX-P) 37.5 MG tablet Take 37.5 mg by mouth daily. 02/21/22  Yes [provider]    Allergies as of 02/28/2022 - Review Complete 02/28/2022  Allergen Reaction Noted   Penicillins Hives 08/01/2018    Family History  Problem Relation Age of Onset   Hyperlipidemia Mother    Hypertension Mother     Social History   Socioeconomic History   Marital status: Single    Spouse name: Not on file   Number of children: Not on file   Years of education: Not on file   Highest education level: Not on file  Occupational History   Not on file  Tobacco Use   Smoking status: Never   Smokeless tobacco: Never  Vaping Use   Vaping Use: Never used  Substance and Sexual Activity   Alcohol use: Yes   Drug use: Never   Sexual activity: Not on file  Other Topics Concern   Not on file  Social History Narrative   Not on file   Social Determinants of Health   Financial Resource Strain: Not on file  Food Insecurity: Not on file  Transportation Needs: Not on file  Physical Activity: Not on file  Stress: Not on file  Social Connections: Not on file  Intimate Partner Violence: Not on file    Review of Systems: See HPI, otherwise negative ROS  Physical Exam: BP (!) 141/101   Pulse 97   Temp 98.1 F (36.7 C) (Temporal)   Resp 20   Wt 93 kg    LMP 02/12/2022   SpO2 97%   BMI 38.74 kg/m  General:   Alert,  pleasant and cooperative in NAD Head:  Normocephalic and atraumatic. Neck:  Supple; no masses or thyromegaly. Lungs:  Clear throughout to auscultation.    Heart:  Regular rate and rhythm. Abdomen:  Soft, nontender and nondistended. Normal bowel sounds, without guarding, and without rebound.   Neurologic:  Alert and  oriented x4;  grossly normal neurologically.  Impression/Plan: Leah Baxter is here for an colonoscopy to be performed for recurrent diverticulitis  Risks, benefits, limitations, and alternatives regarding  colonoscopy have been reviewed with the patient.  Questions have been answered.  All parties agreeable.   Midge Minium, MD  03/20/2022, 7:07 AM

## 2022-03-20 NOTE — Op Note (Signed)
[facility]astroenter[facility] Patient Name: Leah Baxter Procedure Date: 03/20/2022 7:19 AM MRN: [id number] Account #: [id number] Date of Birth: 20-Dec-1995 Admit Type: Outpatient Age: 26 Room: [facility] OR ROOM 01 Gender: Female Note Status: Finalized Instrument Name: [id number] Procedure:             Colonoscopy Indications:           Diverticulitis Providers:             Midge Minium MD, MD Medicines:             Propofol per Anesthesia Complications:         No immediate complications. Procedure:             Pre-Anesthesia Assessment:                        - Prior to the procedure, a History and Physical was                         performed, and patient medications and allergies were                         reviewed. The patient's tolerance of previous                         anesthesia was also reviewed. The risks and benefits                         of the procedure and the sedation options and risks                         were discussed with the patient. All questions were                         answered, and informed consent was obtained. Prior                         Anticoagulants: The patient has taken no previous                         anticoagulant or antiplatelet agents. ASA Grade                         Assessment: II - A patient with mild systemic disease.                         After reviewing the risks and benefits, the patient                         was deemed in satisfactory condition to undergo the                         procedure.                        After obtaining informed consent, the colonoscope was                         passed under direct vision. Throughout the procedure,  the patient's blood pressure, pulse, and oxygen                         saturations were monitored continuously. The                         Colonoscope was introduced through the anus and                         advanced to the the cecum,  identified by appendiceal                         orifice and ileocecal valve. The colonoscopy was                         performed without difficulty. The patient tolerated                         the procedure well. The quality of the bowel                         preparation was excellent. Findings:      The perianal and digital rectal examinations were normal.      Multiple small-mouthed diverticula were found in the entire colon.      Non-bleeding internal hemorrhoids were found during retroflexion. The       hemorrhoids were Grade I (internal hemorrhoids that do not prolapse). Impression:            - Diverticulosis in the entire examined colon.                        - Non-bleeding internal hemorrhoids.                        - No specimens collected. Recommendation:        - Discharge patient to home.                        - Resume previous diet.                        - Continue present medications.                        - Await pathology results. Procedure Code(s):     --- Professional ---                        [phone], Colonoscopy, flexible; diagnostic, including                         collection of specimen(s) by brushing or washing, when                         performed (separate procedure) Diagnosis Code(s):     --- Professional ---                        [id number], Diverticulitis of large intestine without  perforation or abscess without bleeding CPT copyright 2019 American Medical Association. All rights reserved. The codes documented in this report are preliminary and upon coder review may  be revised to meet current compliance requirements. Midge Minium MD, MD 03/20/2022 7:55:00 AM This report has been signed electronically. Number of Addenda: 0 Note Initiated On: 03/20/2022 7:19 AM Scope Withdrawal Time: 0 hours 7 minutes 15 seconds  Total Procedure Duration: 0 hours 10 minutes 23 seconds  Estimated Blood Loss:  Estimated blood loss: none.       [facility]

## 2022-03-20 NOTE — Transfer of Care (Signed)
Immediate Anesthesia Transfer of Care Note  Patient: Leah Baxter  Procedure(s) Performed: COLONOSCOPY  Patient Location: PACU  Anesthesia Type: General  Level of Consciousness: awake, alert  and patient cooperative  Airway and Oxygen Therapy: Patient Spontanous Breathing and Patient connected to supplemental oxygen  Post-op Assessment: Post-op Vital signs reviewed, Patient's Cardiovascular Status Stable, Respiratory Function Stable, Patent Airway and No signs of Nausea or vomiting  Post-op Vital Signs: Reviewed and stable  Complications: No notable events documented.

## 2022-03-20 NOTE — Anesthesia Postprocedure Evaluation (Signed)
Anesthesia Post Note  Patient: Leah Baxter  Procedure(s) Performed: COLONOSCOPY     Patient location during evaluation: PACU Anesthesia Type: General Level of consciousness: awake and alert Pain management: pain level controlled Vital Signs Assessment: post-procedure vital signs reviewed and stable Respiratory status: spontaneous breathing, nonlabored ventilation, respiratory function stable and patient connected to nasal cannula oxygen Cardiovascular status: blood pressure returned to baseline and stable Postop Assessment: no apparent nausea or vomiting Anesthetic complications: no   No notable events documented.  Abrea Henle A  Tyshana Nishida

## 2022-03-20 NOTE — Anesthesia Procedure Notes (Signed)
Date/Time: 03/20/2022 7:38 AM  Performed by: Jimmy Picket, CRNAPre-anesthesia Checklist: Patient identified, Emergency Drugs available, Suction available, Timeout performed and Patient being monitored Patient Re-evaluated:Patient Re-evaluated prior to induction Oxygen Delivery Method: Nasal cannula Placement Confirmation: positive ETCO2

## 2022-03-21 ENCOUNTER — Encounter: Payer: Self-pay | Admitting: Gastroenterology

## 2022-03-30 ENCOUNTER — Telehealth: Payer: Self-pay

## 2022-04-07 NOTE — Telephone Encounter (Signed)
error 

## 2022-09-24 ENCOUNTER — Other Ambulatory Visit: Payer: Self-pay

## 2022-09-24 ENCOUNTER — Emergency Department
Admission: EM | Admit: 2022-09-24 | Discharge: 2022-09-24 | Disposition: A | Discharge status: Home / Self Care | Payer: Managed Care, Other (non HMO) | Attending: Emergency Medicine | Admitting: Emergency Medicine

## 2022-09-24 ENCOUNTER — Emergency Department: Payer: Managed Care, Other (non HMO)

## 2022-09-24 DIAGNOSIS — R109 Unspecified abdominal pain: Secondary | ICD-10-CM | POA: Diagnosis present

## 2022-09-24 DIAGNOSIS — K5732 Diverticulitis of large intestine without perforation or abscess without bleeding: Secondary | ICD-10-CM | POA: Insufficient documentation

## 2022-09-24 DIAGNOSIS — K5792 Diverticulitis of intestine, part unspecified, without perforation or abscess without bleeding: Secondary | ICD-10-CM

## 2022-09-24 LAB — COMPREHENSIVE METABOLIC PANEL
ALT: 88 U/L — ABNORMAL HIGH (ref 0–44)
AST: 53 U/L — ABNORMAL HIGH (ref 15–41)
Albumin: 4.4 g/dL (ref 3.5–5.0)
Alkaline Phosphatase: 62 U/L (ref 38–126)
Anion gap: 10 (ref 5–15)
BUN: 8 mg/dL (ref 6–20)
CO2: 20 mmol/L — ABNORMAL LOW (ref 22–32)
Calcium: 9.3 mg/dL (ref 8.9–10.3)
Chloride: 110 mmol/L (ref 98–111)
Creatinine, Ser: 0.6 mg/dL (ref 0.44–1.00)
GFR, Estimated: >60 mL/min (ref >60)
Glucose, Bld: 107 mg/dL — ABNORMAL HIGH (ref 70–99)
Potassium: 4.2 mmol/L (ref 3.5–5.1)
Sodium: 140 mmol/L (ref 135–145)
Total Bilirubin: 0.9 mg/dL (ref 0.3–1.2)
Total Protein: 8.3 g/dL — ABNORMAL HIGH (ref 6.5–8.1)

## 2022-09-24 LAB — POC URINE PREG, ED: Preg Test, Ur: NEGATIVE

## 2022-09-24 LAB — URINALYSIS, ROUTINE W REFLEX MICROSCOPIC
Bilirubin Urine: NEGATIVE
Glucose, UA: NEGATIVE mg/dL
Hgb urine dipstick: NEGATIVE
Ketones, ur: NEGATIVE mg/dL
Leukocytes,Ua: NEGATIVE
Nitrite: NEGATIVE
Protein, ur: NEGATIVE mg/dL
Specific Gravity, Urine: 1.02 (ref 1.005–1.030)
pH: 5 (ref 5.0–8.0)

## 2022-09-24 LAB — CBC
HCT: 42.8 % (ref 36.0–46.0)
Hemoglobin: 14.6 g/dL (ref 12.0–15.0)
MCH: 32.2 pg (ref 26.0–34.0)
MCHC: 34.1 g/dL (ref 30.0–36.0)
MCV: 94.3 fL (ref 80.0–100.0)
Platelets: 333 10*3/uL (ref 150–400)
RBC: 4.54 MIL/uL (ref 3.87–5.11)
RDW: 12.2 % (ref 11.5–15.5)
WBC: 13.7 10*3/uL — ABNORMAL HIGH (ref 4.0–10.5)
nRBC: 0 % (ref 0.0–0.2)

## 2022-09-24 LAB — LIPASE, BLOOD: Lipase: 33 U/L (ref 11–51)

## 2022-09-24 MED ORDER — ONDANSETRON HCL 4 MG/2ML IJ SOLN
4.0000 mg | Freq: Once | INTRAMUSCULAR | Status: AC
  Administered 2022-09-24: 4 mg via INTRAVENOUS
  Filled 2022-09-24: qty 2

## 2022-09-24 MED ORDER — AMOXICILLIN-POT CLAVULANATE 875-125 MG PO TABS: 1.0000 | ORAL_TABLET | Freq: Two times a day (BID) | ORAL | 0 refills | Status: AC

## 2022-09-24 MED ORDER — MORPHINE SULFATE (PF) 4 MG/ML IV SOLN
4.0000 mg | Freq: Once | INTRAVENOUS | Status: AC
  Administered 2022-09-24: 4 mg via INTRAVENOUS
  Filled 2022-09-24: qty 1

## 2022-09-24 MED ORDER — IOHEXOL 300 MG/ML  SOLN
100.0000 mL | Freq: Once | INTRAMUSCULAR | Status: AC | PRN
  Administered 2022-09-24: 100 mL via INTRAVENOUS

## 2022-09-24 MED ORDER — AMOXICILLIN-POT CLAVULANATE 875-125 MG PO TABS
1.0000 | ORAL_TABLET | Freq: Once | ORAL | Status: AC
  Administered 2022-09-24: 1 via ORAL
  Filled 2022-09-24: qty 1

## 2022-09-24 MED ORDER — LACTATED RINGERS IV BOLUS
1000.0000 mL | Freq: Once | INTRAVENOUS | Status: AC
  Administered 2022-09-24: 1000 mL via INTRAVENOUS

## 2022-09-24 NOTE — ED Provider Notes (Signed)
[facility] Provider Note    Event Date/Time   First MD Initiated Contact with Patient 09/24/22 1059     (approximate)   History   Abdominal Pain   HPI  Leah Baxter is a 26 y.o. female past medical history of obesity, anxiety and depression who presents with abdominal pain.  Patient first noticed a dull pain in the right upper abdomen yesterday morning.  She thought she may have slept wrong on it and just dealt with it throughout the day.  This morning the pain has been more severe.  Points to her right upper quadrant as well location of pain.  Currently pain seems to be coming and going feels worse with movement better when she lays still.  Has no associated nausea vomiting diarrhea, stool has been slightly more loose.  Has not eaten much this morning.  Denies fevers or chills.  No urinary symptoms.  Denies history of similar pain.     Past Medical History:  Diagnosis Date   Anxiety    Depression     Patient Active Problem List   Diagnosis Date Noted   Diverticulitis 04/11/2020   Acute diverticulitis 04/10/2020   Class 2 obesity due to excess calories without serious comorbidity with body mass index (BMI) of 36.0 to 36.9 in adult 08/02/2018   Anxiety and depression 08/02/2018     Physical Exam  Triage Vital Signs: ED Triage Vitals  Enc Vitals Group     BP 09/24/22 0817 (!) 136/96     Pulse Rate 09/24/22 0817 (!) 127     Resp 09/24/22 0817 18     Temp 09/24/22 0817 98.2 F (36.8 C)     Temp Source 09/24/22 0817 Oral     SpO2 09/24/22 0817 97 %     Weight 09/24/22 0815 210 lb (95.3 kg)     Height 09/24/22 0815 5\' 1"  (1.549 m)     Head Circumference --      Peak Flow --      Pain Score 09/24/22 0815 9     Pain Loc --      Pain Edu? --      Excl. in GC? --     Most recent vital signs: Vitals:   09/24/22 1140 09/24/22 1233  BP: (!) 137/94 119/83  Pulse: (!) 114 (!) 101  Resp: 20 20  Temp:    SpO2: 98% 98%     General: Awake, no  distress.  CV:  Good peripheral perfusion.  Resp:  Normal effort.  Abd:  No distention.  Tenderness to palpation in the right lower quadrant with voluntary guarding, no right upper quadrant tenderness Neuro:             Awake, Alert, Oriented x 3  Other:  Patient uncomfortable with walking   ED Results / Procedures / Treatments  Labs (all labs ordered are listed, but only abnormal results are displayed) Labs Reviewed  COMPREHENSIVE METABOLIC PANEL - Abnormal; Notable for the following components:      Result Value   CO2 20 (*)    Glucose, Bld 107 (*)    Total Protein 8.3 (*)    AST 53 (*)    ALT 88 (*)    All other components within normal limits  CBC - Abnormal; Notable for the following components:   WBC 13.7 (*)    All other components within normal limits  URINALYSIS, ROUTINE W REFLEX MICROSCOPIC - Abnormal; Notable for the following components:  Color, Urine YELLOW (*)    APPearance HAZY (*)    All other components within normal limits  LIPASE, BLOOD  POC URINE PREG, ED     EKG     RADIOLOGY Reviewed and interpreted the CT of the abdomen pelvis which shows inflammation of the transverse colon consistent with diverticulitis without complication   PROCEDURES:  Critical Care performed: No  Procedures   MEDICATIONS ORDERED IN ED: Medications  morphine (PF) 4 MG/ML injection 4 mg (4 mg Intravenous Given 09/24/22 1135)  lactated ringers bolus 1,000 mL (1,000 mLs Intravenous New Bag/Given 09/24/22 1130)  iohexol (OMNIPAQUE) 300 MG/ML solution 100 mL (100 mLs Intravenous Contrast Given 09/24/22 1145)  ondansetron (ZOFRAN) injection 4 mg (4 mg Intravenous Given 09/24/22 1242)  amoxicillin-clavulanate (AUGMENTIN) 875-125 MG per tablet 1 tablet (1 tablet Oral Given 09/24/22 1242)     IMPRESSION / MDM / ASSESSMENT AND PLAN / ED COURSE  I reviewed the triage vital signs and the nursing notes.                              Patient's presentation is most  consistent with acute presentation with potential threat to life or bodily function.  Differential diagnosis includes, but is not limited to, acute appendicitis, kidney stone, mesenteric adenitis, cholecystitis, pancreatitis  The patient is a 26 year old female presents with right lower quadrant pain.  Patient points to right upper quadrant when describing the location of pain but on my exam she is really tender primarily in the right lower quadrant and is voluntary guarding.  Symptoms started yesterday were not as severe as this morning.  Pain seems to be worse with movement no associated nausea vomiting diarrhea fevers or chills or urinary symptoms.  Initial vital signs notable for tachycardia.  She does have a leukocytosis to 14 mild elevation in AST and ALT.  Plan to obtain a CT abdomen pelvis with contrast to evaluate for appendicitis.  Patient CT actually shows uncomplicated diverticulitis of the transverse colon.  Her heart rate has improved with fluids she is tolerating p.o.  Plan to discharge with 7 days of Augmentin.  She has already seen gastroenterology and had colonoscopy for her frequent diverticulitis in the past.  Recommended follow-up with GI.  Discussed return precautions. Clinical Course as of 09/24/22 1305  Sun Sep 24, 2022  1139 Preg Test, Ur: Negative [KM]    Clinical Course User Index [KM] Georga Hacking, MD     FINAL CLINICAL IMPRESSION(S) / ED DIAGNOSES   Final diagnoses:  Diverticulitis     Rx / DC Orders   ED Discharge Orders          Ordered    amoxicillin-clavulanate (AUGMENTIN) 875-125 MG tablet  2 times daily        09/24/22 1305             Note:  This document was prepared using Dragon voice recognition software and may include unintentional dictation errors.   Georga Hacking, MD 09/24/22 [phone]

## 2022-09-24 NOTE — ED Notes (Signed)
Pt leaves with stable VS with friend. Verbalizes understanding of all discharge instructions.

## 2022-09-24 NOTE — ED Notes (Signed)
Pt arrives with RLQ abd pain since yesterday. Alert and oriented x 4 - pt RR even and unlabored.

## 2022-09-24 NOTE — ED Triage Notes (Signed)
Pt states that she started yesterday morning with pain in her RUQ- pt states it started as a dull pain but this morning at 3 it woke her up and it has gotten increasingly worse since- pt is tearful in triage- pt denies n/v/d

## 2022-09-24 NOTE — Discharge Instructions (Addendum)
You have diverticulitis of your transverse colon which is causing your pain.  Please take the antibiotic twice a day for the next 7 days.  Please stick with clear liquids while you are still feeling sick you can then advance your diet as tolerated.  Please follow-up with your GI doctor.

## 2022-10-05 ENCOUNTER — Other Ambulatory Visit: Payer: Self-pay

## 2022-10-05 ENCOUNTER — Emergency Department
Admission: EM | Admit: 2022-10-05 | Discharge: 2022-10-05 | Disposition: A | Discharge status: Home / Self Care | Payer: Managed Care, Other (non HMO) | Attending: Emergency Medicine | Admitting: Emergency Medicine

## 2022-10-05 ENCOUNTER — Encounter: Payer: Self-pay | Admitting: Emergency Medicine

## 2022-10-05 ENCOUNTER — Emergency Department: Payer: Managed Care, Other (non HMO)

## 2022-10-05 DIAGNOSIS — R109 Unspecified abdominal pain: Secondary | ICD-10-CM | POA: Diagnosis present

## 2022-10-05 DIAGNOSIS — K5732 Diverticulitis of large intestine without perforation or abscess without bleeding: Secondary | ICD-10-CM | POA: Insufficient documentation

## 2022-10-05 LAB — URINALYSIS, ROUTINE W REFLEX MICROSCOPIC
Bacteria, UA: NONE SEEN
Bilirubin Urine: NEGATIVE
Glucose, UA: NEGATIVE mg/dL
Hgb urine dipstick: NEGATIVE
Ketones, ur: 80 mg/dL — AB
Leukocytes,Ua: NEGATIVE
Nitrite: NEGATIVE
Protein, ur: 30 mg/dL — AB
Specific Gravity, Urine: 1.025 (ref 1.005–1.030)
pH: 5 (ref 5.0–8.0)

## 2022-10-05 LAB — COMPREHENSIVE METABOLIC PANEL
ALT: 57 U/L — ABNORMAL HIGH (ref 0–44)
AST: 35 U/L (ref 15–41)
Albumin: 4.3 g/dL (ref 3.5–5.0)
Alkaline Phosphatase: 61 U/L (ref 38–126)
Anion gap: 16 — ABNORMAL HIGH (ref 5–15)
BUN: 7 mg/dL (ref 6–20)
CO2: 18 mmol/L — ABNORMAL LOW (ref 22–32)
Calcium: 9.4 mg/dL (ref 8.9–10.3)
Chloride: 106 mmol/L (ref 98–111)
Creatinine, Ser: 0.67 mg/dL (ref 0.44–1.00)
GFR, Estimated: >60 mL/min (ref >60)
Glucose, Bld: 83 mg/dL (ref 70–99)
Potassium: 4.3 mmol/L (ref 3.5–5.1)
Sodium: 140 mmol/L (ref 135–145)
Total Bilirubin: 1.7 mg/dL — ABNORMAL HIGH (ref 0.3–1.2)
Total Protein: 8.7 g/dL — ABNORMAL HIGH (ref 6.5–8.1)

## 2022-10-05 LAB — CBC
HCT: 43.9 % (ref 36.0–46.0)
Hemoglobin: 14.2 g/dL (ref 12.0–15.0)
MCH: 31.5 pg (ref 26.0–34.0)
MCHC: 32.3 g/dL (ref 30.0–36.0)
MCV: 97.3 fL (ref 80.0–100.0)
Platelets: 330 10*3/uL (ref 150–400)
RBC: 4.51 MIL/uL (ref 3.87–5.11)
RDW: 12 % (ref 11.5–15.5)
WBC: 21.1 10*3/uL — ABNORMAL HIGH (ref 4.0–10.5)
nRBC: 0 % (ref 0.0–0.2)

## 2022-10-05 LAB — POC URINE PREG, ED: Preg Test, Ur: NEGATIVE

## 2022-10-05 LAB — LIPASE, BLOOD: Lipase: 28 U/L (ref 11–51)

## 2022-10-05 MED ORDER — TRAMADOL HCL 50 MG PO TABS: 50.0000 mg | ORAL_TABLET | Freq: Four times a day (QID) | ORAL | 0 refills | Status: DC | PRN

## 2022-10-05 MED ORDER — IOHEXOL 300 MG/ML  SOLN
100.0000 mL | Freq: Once | INTRAMUSCULAR | Status: AC | PRN
  Administered 2022-10-05: 100 mL via INTRAVENOUS

## 2022-10-05 MED ORDER — CIPROFLOXACIN HCL 500 MG PO TABS: 500.0000 mg | ORAL_TABLET | Freq: Two times a day (BID) | ORAL | 0 refills | Status: AC

## 2022-10-05 MED ORDER — METRONIDAZOLE 500 MG PO TABS: 500.0000 mg | ORAL_TABLET | Freq: Three times a day (TID) | ORAL | 0 refills | Status: AC

## 2022-10-05 MED ORDER — MORPHINE SULFATE (PF) 2 MG/ML IV SOLN
2.0000 mg | Freq: Once | INTRAVENOUS | Status: AC
  Administered 2022-10-05: 2 mg via INTRAVENOUS
  Filled 2022-10-05: qty 1

## 2022-10-05 MED ORDER — ONDANSETRON HCL 4 MG/2ML IJ SOLN
4.0000 mg | Freq: Once | INTRAMUSCULAR | Status: AC
  Administered 2022-10-05: 4 mg via INTRAVENOUS
  Filled 2022-10-05: qty 2

## 2022-10-05 MED ORDER — SODIUM CHLORIDE 0.9 % IV BOLUS
1000.0000 mL | Freq: Once | INTRAVENOUS | Status: AC
  Administered 2022-10-05: 1000 mL via INTRAVENOUS

## 2022-10-05 MED ORDER — CIPROFLOXACIN IN D5W 400 MG/200ML IV SOLN
400.0000 mg | Freq: Once | INTRAVENOUS | Status: AC
  Administered 2022-10-05: 400 mg via INTRAVENOUS
  Filled 2022-10-05: qty 200

## 2022-10-05 MED ORDER — MORPHINE SULFATE (PF) 4 MG/ML IV SOLN
4.0000 mg | Freq: Once | INTRAVENOUS | Status: AC
  Administered 2022-10-05: 4 mg via INTRAVENOUS
  Filled 2022-10-05: qty 1

## 2022-10-05 MED ORDER — ONDANSETRON 4 MG PO TBDP: 4.0000 mg | ORAL_TABLET | Freq: Three times a day (TID) | ORAL | 0 refills | Status: DC | PRN

## 2022-10-05 MED ORDER — METRONIDAZOLE 500 MG/100ML IV SOLN
500.0000 mg | Freq: Once | INTRAVENOUS | Status: AC
  Administered 2022-10-05: 500 mg via INTRAVENOUS
  Filled 2022-10-05: qty 100

## 2022-10-05 NOTE — ED Notes (Signed)
This RN attempted IV without success. Pt difficult IV stick. Will place order for IV consult.

## 2022-10-05 NOTE — ED Notes (Signed)
Poct pregnancy Negative 

## 2022-10-05 NOTE — ED Provider Notes (Signed)
[facility] Provider Note    Event Date/Time   First MD Initiated Contact with Patient 10/05/22 1844     (approximate)   History   Abdominal Pain   HPI  Leah Baxter is a 27 y.o. female with history of diverticulitis presents to the emergency department for treatment and evaluation of abdominal pain.  Symptoms started suddenly on Christmas Eve.  She was evaluated here and placed on antibiotics for diverticulitis flare.  She had been improving with antibiotics and clear liquid diet until last night.  Symptoms acutely worsened and she is now unable to tolerate any food or fluids and feels bloated.  No known fever.  She also feels constipated.     Physical Exam   Triage Vital Signs: ED Triage Vitals  Enc Vitals Group     BP 10/05/22 1720 (!) 148/87     Pulse Rate 10/05/22 1720 (!) 118     Resp 10/05/22 1720 20     Temp 10/05/22 1720 98.9 F (37.2 C)     Temp Source 10/05/22 1720 Oral     SpO2 10/05/22 1720 96 %     Weight --      Height --      Head Circumference --      Peak Flow --      Pain Score 10/05/22 1716 5     Pain Loc --      Pain Edu? --      Excl. in [place]? --     Most recent vital signs: Vitals:   10/05/22 2100 10/05/22 2155  BP: (!) 138/94 (!) 130/90  Pulse:  100  Resp:  16  Temp:  98.5 F (36.9 C)  SpO2:  98%     General: Awake, no distress.  CV:  Good peripheral perfusion.  Resp:  Normal effort.  Abd:  Distended, soft, diffuse tenderness Other:     ED Results / Procedures / Treatments   Labs (all labs ordered are listed, but only abnormal results are displayed) Labs Reviewed  COMPREHENSIVE METABOLIC PANEL - Abnormal; Notable for the following components:      Result Value   CO2 18 (*)    Total Protein 8.7 (*)    ALT 57 (*)    Total Bilirubin 1.7 (*)    Anion gap 16 (*)    All other components within normal limits  CBC - Abnormal; Notable for the following components:   WBC 21.1 (*)    All other components  within normal limits  URINALYSIS, ROUTINE W REFLEX MICROSCOPIC - Abnormal; Notable for the following components:   Color, Urine AMBER (*)    APPearance HAZY (*)    Ketones, ur 80 (*)    Protein, ur 30 (*)    All other components within normal limits  LIPASE, BLOOD  POC URINE PREG, ED     EKG     RADIOLOGY  CT abdomen and pelvis with contrast shows diffuse diverticular disease of the colon with some improvement at the transverse colon compared to previous imaging.  Now has fairly extensive inflammation and wall thickening of the sigmoid colon consistent with acute sigmoid colon diverticulitis.  No abscess or intraluminal gas.  PROCEDURES:  Critical Care performed: No  Procedures   MEDICATIONS ORDERED IN ED: Medications  metroNIDAZOLE (FLAGYL) IVPB 500 mg (has no administration in time range)  morphine (PF) 4 MG/ML injection 4 mg (has no administration in time range)  sodium chloride 0.9 % bolus 1,000 mL (  0 mLs Intravenous Stopped 10/05/22 2238)  morphine (PF) 2 MG/ML injection 2 mg (2 mg Intravenous Given 10/05/22 2059)  ondansetron (ZOFRAN) injection 4 mg (4 mg Intravenous Given 10/05/22 2059)  ciprofloxacin (CIPRO) IVPB 400 mg (0 mg Intravenous Stopped 10/05/22 2255)  iohexol (OMNIPAQUE) 300 MG/ML solution 100 mL (100 mLs Intravenous Contrast Given 10/05/22 2129)     IMPRESSION / MDM / ASSESSMENT AND PLAN / ED COURSE  I reviewed the triage vital signs and the nursing notes.                              Differential diagnosis includes, but is not limited to, diverticulitis, intra-abdominal abscess, bowel perforation, small bowel obstruction  Patient's presentation is most consistent with acute presentation with potential threat to life or bodily function.  27 year old female presenting to the emergency department for treatment and evaluation of progressive abdominal pain.  See HPI for further details.  Patient states that this feels like diverticulitis that she has  experienced in the past however patient's pain acutely worsened last night.   On review of previous CT, she did have a small perforation in 2021. Because pain acutely increased last night, plan will be to get another CT to rule out perforation or development of abscess. Patient aware and agreeable to the plan.  CT without concern for perforation or abscess. There is some improvement of the transverse colon but now significant inflammation and wall thickening in the sigmoid colon.  White blood cell count today is 21.1 and in comparison to 13.7 about 11 days ago.  She has been treated with Augmentin at that visit.  Plan will be to use ciprofloxacin and Flagyl tonight.   Admission offered however patient would like to go home and take ciprofloxacin and Flagyl.  She states that these medications have caused some nausea in the past and I will add Zofran to be taken if needed.  She will be encouraged to schedule follow-up appointment with gastroenterology.  ER return precautions discussed and she will return to the emergency department for symptoms that change or worsen if she is unable to see her primary care provider or the specialist.     FINAL CLINICAL IMPRESSION(S) / ED DIAGNOSES   Final diagnoses:  Diverticulitis of sigmoid colon     Rx / DC Orders   ED Discharge Orders          Ordered    ciprofloxacin (CIPRO) 500 MG tablet  2 times daily        10/05/22 2313    traMADol (ULTRAM) 50 MG tablet  Every 6 hours PRN        10/05/22 2313    metroNIDAZOLE (FLAGYL) 500 MG tablet  3 times daily        10/05/22 2314    ondansetron (ZOFRAN-ODT) 4 MG disintegrating tablet  Every 8 hours PRN        10/05/22 2314             Note:  This document was prepared using Dragon voice recognition software and may include unintentional dictation errors.   Victorino Dike, FNP 10/05/22 2314    Rada Hay, MD 10/06/22 Quentin Mulling

## 2022-10-05 NOTE — ED Notes (Signed)
RN to bedside to introduce self to pt. Pt advised she has diverticulitis.

## 2022-10-05 NOTE — ED Triage Notes (Signed)
Pt comes with c/o belly pain. Pt states this all started Christmas eve and was prescribed meds. Pt states she didn't take the meds correctly. Pt state she did also mix alcohol with the meds. Pt states 5/10 pain. Pt states some diarrhea.

## 2022-10-05 NOTE — ED Notes (Signed)
IV team at bedside 

## 2022-11-01 IMAGING — CT CT ABD-PELV W/O CM
2 of 4 series · 16 of 46 positions shown, 18 images · non-contrast
Comparison: CT abdomen pelvis 04/10/2020

CLINICAL DATA: Diverticulitis suspected long hx divertic,
microperforation last year, recurrent LLQ pain/TTP today.
leukocytosis. eval

EXAM:
CT ABDOMEN AND PELVIS WITHOUT CONTRAST
TECHNIQUE: Multidetector CT imaging of the abdomen and pelvis was performed
following the standard protocol without IV contrast.

[Series 2: routine abd/pel wo · axial · 0.88mm/px · z∈[-418,+36]mm · 13 of 99 slices shown, 15 images]
[im 4/99  soft-tissue]
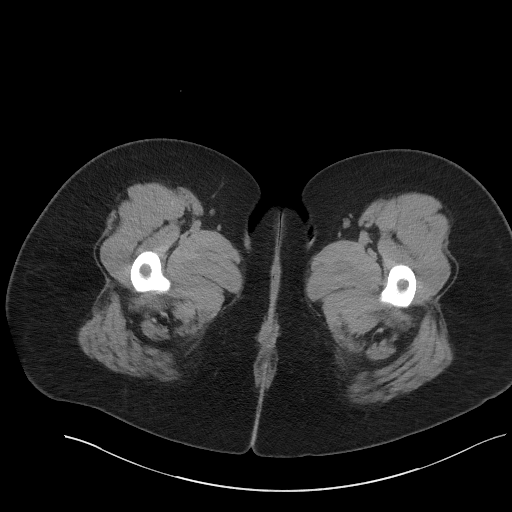
[im 4/99  bone]
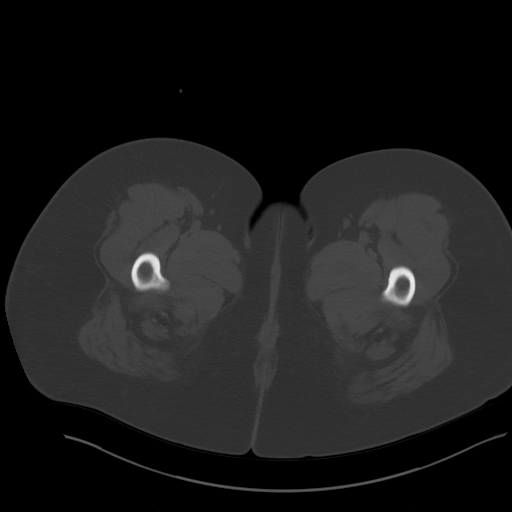
[im 12/99  soft-tissue]
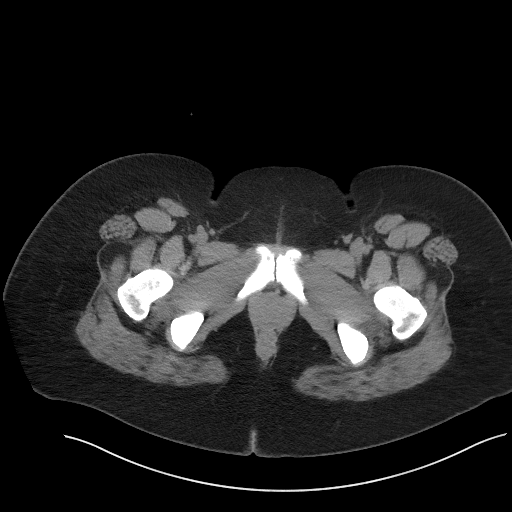
[im 20/99  soft-tissue]
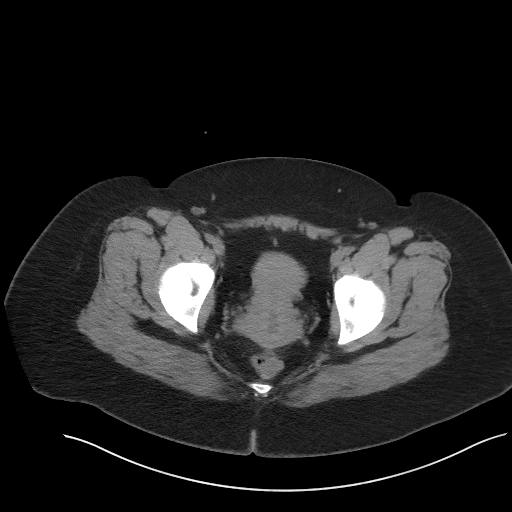
[im 28/99  soft-tissue]
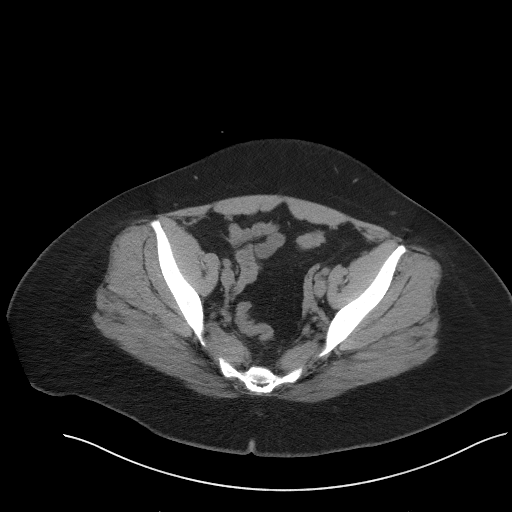
[im 36/99  soft-tissue]
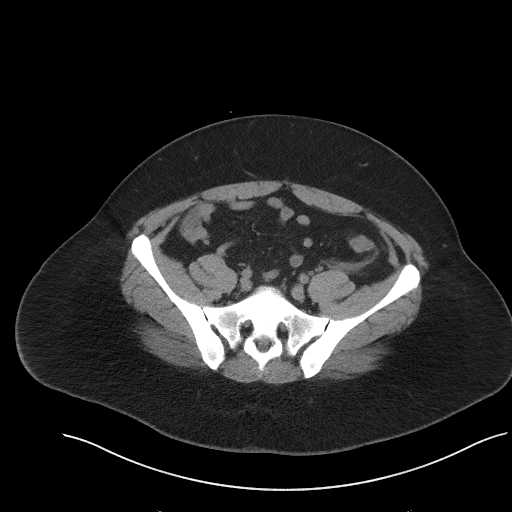
[im 44/99  soft-tissue]
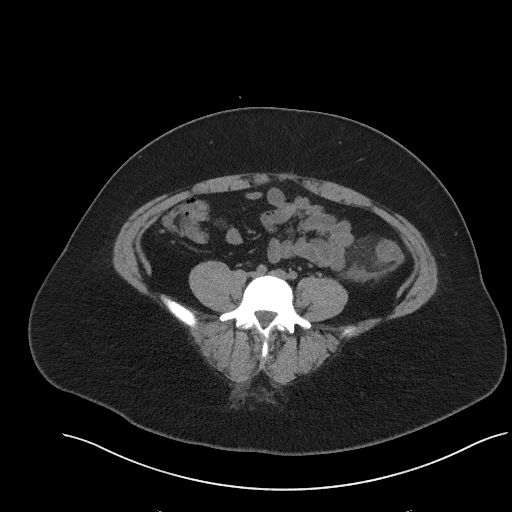
[im 51/99  soft-tissue]
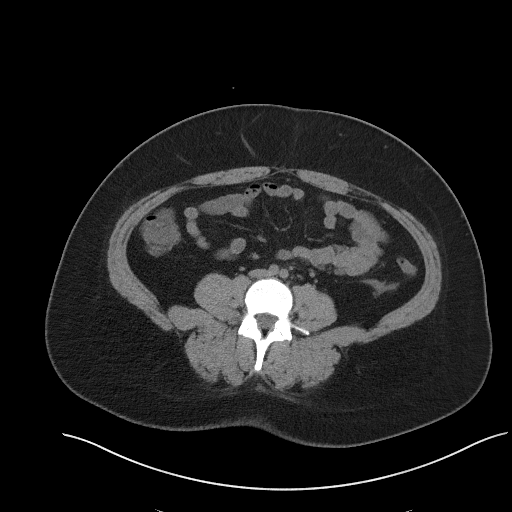
[im 55/99  soft-tissue]
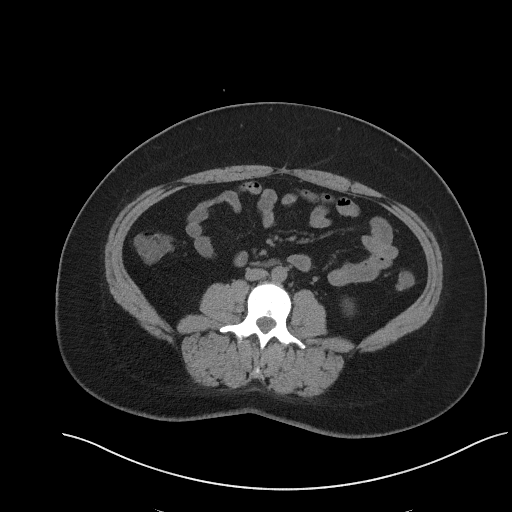
[im 63/99  soft-tissue]
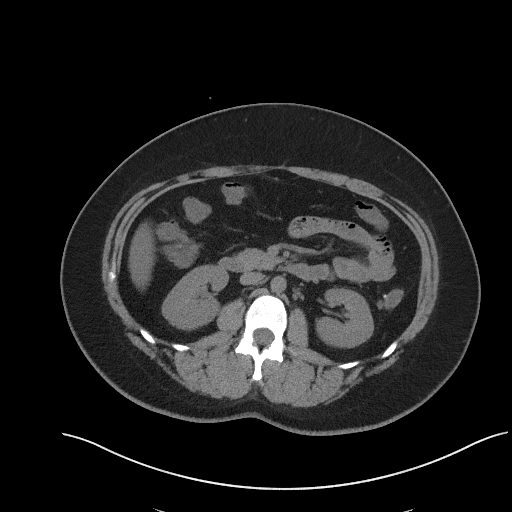
[im 63/99  bone]
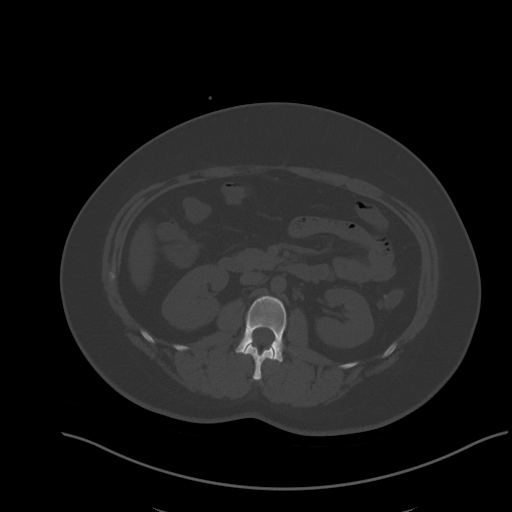
[im 71/99  soft-tissue]
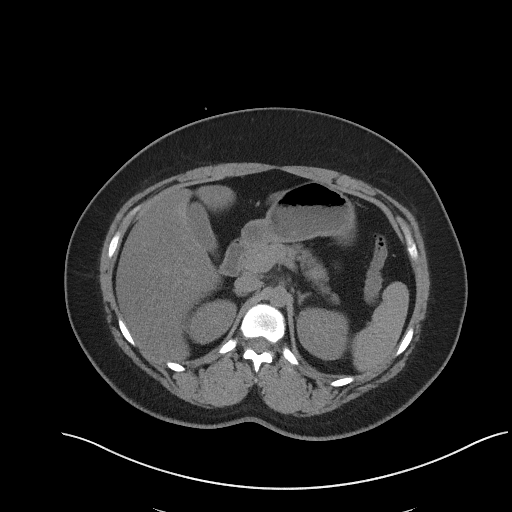
[im 79/99  soft-tissue]
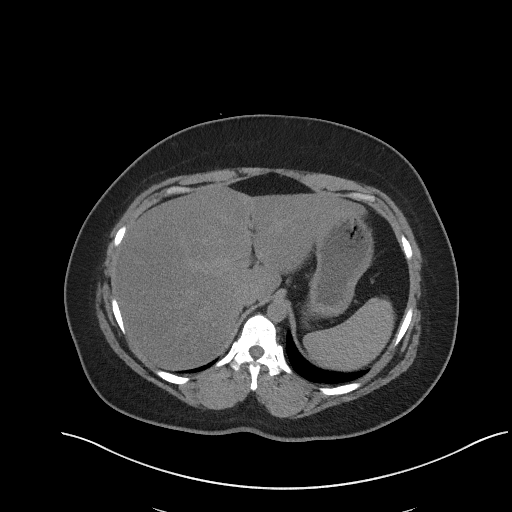
[im 87/99  soft-tissue]
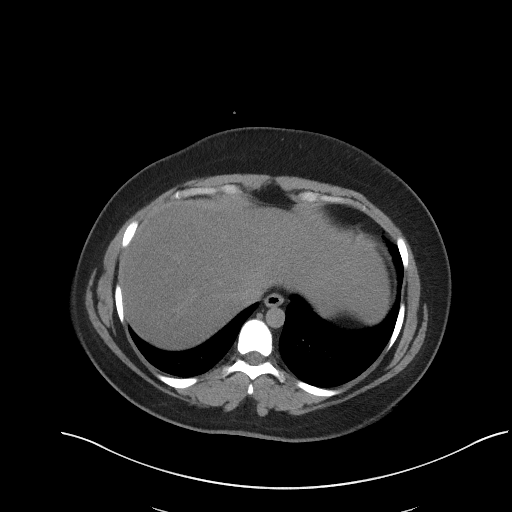
[im 95/99  soft-tissue]
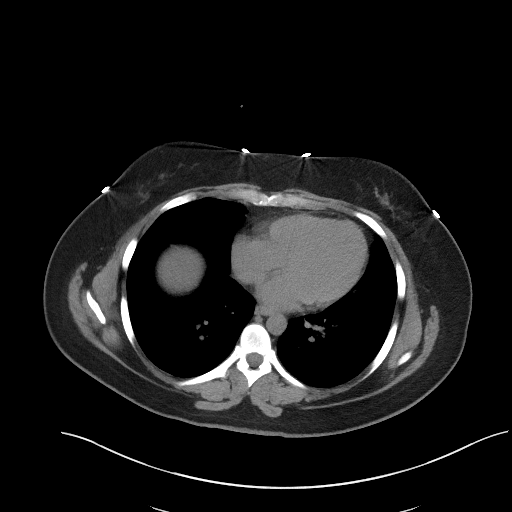

[Series 5: coronal st · coronal · 0.92mm/px · 3 of 99 slices shown]
[im 33/99  soft-tissue]
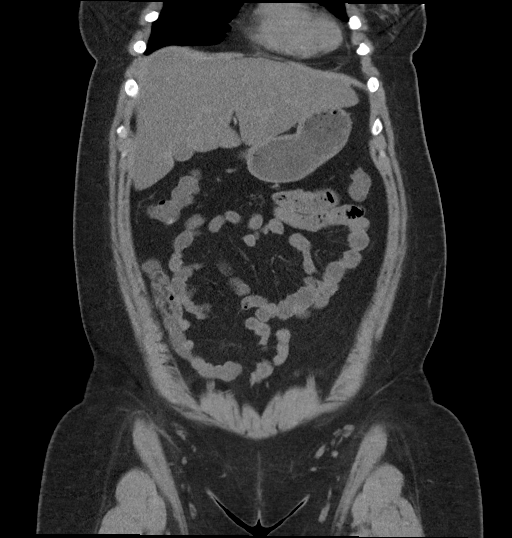
[im 44/99  soft-tissue]
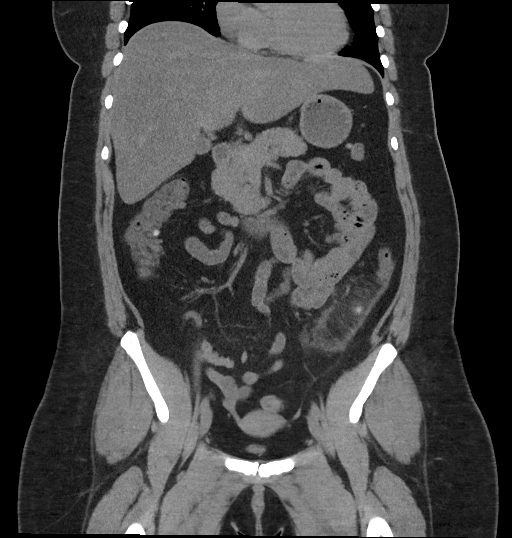
[im 55/99  soft-tissue]
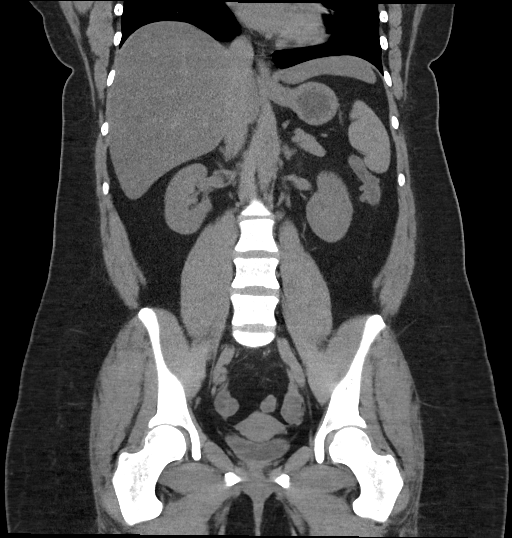

[16 of 46 positions shown; findings below may reference images not displayed]

FINDINGS: Lower chest: No acute abnormality.

Hepatobiliary: The hepatic parenchyma is diffusely hypodense
compared to the splenic parenchyma consistent with fatty
infiltration. No focal liver abnormality. No gallstones, gallbladder
wall thickening, or pericholecystic fluid. No biliary dilatation.

Pancreas: No focal lesion. Normal pancreatic contour. No surrounding
inflammatory changes. No main pancreatic ductal dilatation.

Spleen: Normal in size without focal abnormality.

Adrenals/Urinary Tract:

No adrenal nodule bilaterally.

No nephrolithiasis, no hydronephrosis, and no contour-deforming
renal mass. No ureterolithiasis or hydroureter.

The urinary bladder is unremarkable.

Stomach/Bowel: Stomach is within normal limits. No evidence of small
bowel wall thickening or dilatation. Scattered colonic
diverticulosis with bowel wall thickening of the distal descending
colon and associated pericolonic fat stranding. Appendix appears
normal.

Vascular/Lymphatic: No significant vascular findings are present. No
enlarged abdominal or pelvic lymph nodes.

Reproductive: Uterus and bilateral adnexa are unremarkable.

Other: No intraperitoneal free gas. No organized fluid collection.

Musculoskeletal: No acute or significant osseous findings.
IMPRESSION: 1. Scattered colonic diverticulosis with distal descending colon
diverticulitis. Associated trace pericolic free fluid with no
definite free intraperitoneal gas to confirm a microperforation. No
intramural abscess formation.
2. Hepatic steatosis.

## 2023-03-28 ENCOUNTER — Other Ambulatory Visit: Payer: Self-pay

## 2023-03-28 ENCOUNTER — Emergency Department
Admission: EM | Admit: 2023-03-28 | Discharge: 2023-03-29 | Disposition: A | Discharge status: Home / Self Care | Payer: Managed Care, Other (non HMO) | Attending: Emergency Medicine | Admitting: Emergency Medicine

## 2023-03-28 DIAGNOSIS — R110 Nausea: Secondary | ICD-10-CM | POA: Insufficient documentation

## 2023-03-28 DIAGNOSIS — K5732 Diverticulitis of large intestine without perforation or abscess without bleeding: Secondary | ICD-10-CM | POA: Diagnosis not present

## 2023-03-28 DIAGNOSIS — R1032 Left lower quadrant pain: Secondary | ICD-10-CM

## 2023-03-28 DIAGNOSIS — R739 Hyperglycemia, unspecified: Secondary | ICD-10-CM | POA: Insufficient documentation

## 2023-03-28 DIAGNOSIS — R109 Unspecified abdominal pain: Secondary | ICD-10-CM | POA: Diagnosis present

## 2023-03-28 DIAGNOSIS — K5792 Diverticulitis of intestine, part unspecified, without perforation or abscess without bleeding: Secondary | ICD-10-CM

## 2023-03-28 LAB — CBC
HCT: 46.2 % — ABNORMAL HIGH (ref 36.0–46.0)
Hemoglobin: 15.5 g/dL — ABNORMAL HIGH (ref 12.0–15.0)
MCH: 31.3 pg (ref 26.0–34.0)
MCHC: 33.5 g/dL (ref 30.0–36.0)
MCV: 93.3 fL (ref 80.0–100.0)
Platelets: 321 10*3/uL (ref 150–400)
RBC: 4.95 MIL/uL (ref 3.87–5.11)
RDW: 12.5 % (ref 11.5–15.5)
WBC: 9.4 10*3/uL (ref 4.0–10.5)
nRBC: 0 % (ref 0.0–0.2)

## 2023-03-28 LAB — COMPREHENSIVE METABOLIC PANEL
ALT: 84 U/L — ABNORMAL HIGH (ref 0–44)
AST: 65 U/L — ABNORMAL HIGH (ref 15–41)
Albumin: 4.8 g/dL (ref 3.5–5.0)
Alkaline Phosphatase: 60 U/L (ref 38–126)
Anion gap: 11 (ref 5–15)
BUN: 14 mg/dL (ref 6–20)
CO2: 23 mmol/L (ref 22–32)
Calcium: 11.8 mg/dL — ABNORMAL HIGH (ref 8.9–10.3)
Chloride: 101 mmol/L (ref 98–111)
Creatinine, Ser: 0.72 mg/dL (ref 0.44–1.00)
GFR, Estimated: >60 mL/min (ref >60)
Glucose, Bld: 90 mg/dL (ref 70–99)
Potassium: 4 mmol/L (ref 3.5–5.1)
Sodium: 135 mmol/L (ref 135–145)
Total Bilirubin: 1.5 mg/dL — ABNORMAL HIGH (ref 0.3–1.2)
Total Protein: 8.3 g/dL — ABNORMAL HIGH (ref 6.5–8.1)

## 2023-03-28 LAB — POC URINE PREG, ED: Preg Test, Ur: NEGATIVE

## 2023-03-28 LAB — LIPASE, BLOOD: Lipase: 30 U/L (ref 11–51)

## 2023-03-28 NOTE — ED Triage Notes (Signed)
Pt to ED via POV c/o abd pain. Pt states she thinks she is having a diverticulitis flare up, hx of diverticulitis. Pt having LLQ pain, described as "pressure". Pt endorsing nausea and diarrhea. Denies CP, SOB, fevers

## 2023-03-29 ENCOUNTER — Emergency Department: Payer: Managed Care, Other (non HMO)

## 2023-03-29 LAB — URINALYSIS, ROUTINE W REFLEX MICROSCOPIC
Bilirubin Urine: NEGATIVE
Glucose, UA: NEGATIVE mg/dL
Hgb urine dipstick: NEGATIVE
Ketones, ur: 5 mg/dL — AB
Leukocytes,Ua: NEGATIVE
Nitrite: NEGATIVE
Protein, ur: NEGATIVE mg/dL
Specific Gravity, Urine: 1.025 (ref 1.005–1.030)
pH: 5 (ref 5.0–8.0)

## 2023-03-29 MED ORDER — IOHEXOL 300 MG/ML  SOLN
100.0000 mL | Freq: Once | INTRAMUSCULAR | Status: AC | PRN
  Administered 2023-03-29: 100 mL via INTRAVENOUS

## 2023-03-29 MED ORDER — MORPHINE SULFATE (PF) 4 MG/ML IV SOLN
4.0000 mg | Freq: Once | INTRAVENOUS | Status: AC
  Administered 2023-03-29: 4 mg via INTRAVENOUS
  Filled 2023-03-29: qty 1

## 2023-03-29 MED ORDER — SODIUM CHLORIDE 0.9 % IV BOLUS
1000.0000 mL | Freq: Once | INTRAVENOUS | Status: AC
  Administered 2023-03-29: 1000 mL via INTRAVENOUS

## 2023-03-29 MED ORDER — CIPROFLOXACIN HCL 500 MG PO TABS
500.0000 mg | ORAL_TABLET | Freq: Once | ORAL | Status: AC
  Administered 2023-03-29: 500 mg via ORAL
  Filled 2023-03-29: qty 1

## 2023-03-29 MED ORDER — METRONIDAZOLE 500 MG PO TABS: 500.0000 mg | ORAL_TABLET | Freq: Three times a day (TID) | ORAL | 0 refills | Status: AC

## 2023-03-29 MED ORDER — METRONIDAZOLE 500 MG PO TABS
500.0000 mg | ORAL_TABLET | Freq: Once | ORAL | Status: AC
  Administered 2023-03-29: 500 mg via ORAL
  Filled 2023-03-29: qty 1

## 2023-03-29 MED ORDER — ONDANSETRON 4 MG PO TBDP: 4.0000 mg | ORAL_TABLET | Freq: Three times a day (TID) | ORAL | 0 refills | Status: DC | PRN

## 2023-03-29 MED ORDER — ONDANSETRON HCL 4 MG/2ML IJ SOLN
4.0000 mg | Freq: Once | INTRAMUSCULAR | Status: AC
  Administered 2023-03-29: 4 mg via INTRAVENOUS
  Filled 2023-03-29: qty 2

## 2023-03-29 MED ORDER — CIPROFLOXACIN HCL 500 MG PO TABS: 500.0000 mg | ORAL_TABLET | Freq: Two times a day (BID) | ORAL | 0 refills | Status: AC

## 2023-03-29 MED ORDER — HYDROCODONE-ACETAMINOPHEN 5-325 MG PO TABS: 1.0000 | ORAL_TABLET | Freq: Four times a day (QID) | ORAL | 0 refills | Status: DC | PRN

## 2023-03-29 NOTE — ED Provider Notes (Signed)
[facility] Provider Note    Event Date/Time   First MD Initiated Contact with Patient 03/28/23 2358     (approximate)   History   Abdominal Pain   HPI  Leah Baxter is a 27 y.o. female here with abdominal pain.  The patient has a history of diverticulitis.  She has been seen multiple times for this.  She states that over the last day or 2, she separatively worsening aching, throbbing, left-sided abdominal pain.  Feels similar to previous episodes diverticulitis.  Denies any fevers.  Denies any significant change in her bowel habits.  She had some mild nausea.     Physical Exam   Triage Vital Signs: ED Triage Vitals  Enc Vitals Group     BP 03/28/23 1946 (!) 158/117     Pulse Rate 03/28/23 1946 92     Resp 03/28/23 1946 18     Temp 03/28/23 1946 98.6 F (37 C)     Temp Source 03/28/23 1946 Oral     SpO2 03/28/23 1946 97 %     Weight 03/28/23 1946 215 lb (97.5 kg)     Height 03/28/23 1946 5\' 1"  (1.549 m)     Head Circumference --      Peak Flow --      Pain Score 03/28/23 1951 5     Pain Loc --      Pain Edu? --      Excl. in [place]? --     Most recent vital signs: Vitals:   03/28/23 2340 03/29/23 0223  BP: (!) 154/100 (!) 139/100  Pulse: 90 88  Resp: 18 18  Temp: 98.5 F (36.9 C) 98.5 F (36.9 C)  SpO2: 97% 97%     General: Awake, no distress.  CV:  Good peripheral perfusion.  Resp:  Normal work of breathing.  Abd:  No distention.  Mild left upper and lower quadrant tenderness.  No rebound or guarding. Other:  Nontoxic.  Mildly dry mucous membranes.   ED Results / Procedures / Treatments   Labs (all labs ordered are listed, but only abnormal results are displayed) Labs Reviewed  COMPREHENSIVE METABOLIC PANEL - Abnormal; Notable for the following components:      Result Value   Calcium 11.8 (*)    Total Protein 8.3 (*)    AST 65 (*)    ALT 84 (*)    Total Bilirubin 1.5 (*)    All other components within normal limits  CBC  - Abnormal; Notable for the following components:   Hemoglobin 15.5 (*)    HCT 46.2 (*)    All other components within normal limits  URINALYSIS, ROUTINE W REFLEX MICROSCOPIC - Abnormal; Notable for the following components:   Color, Urine YELLOW (*)    APPearance HAZY (*)    Ketones, ur 5 (*)    All other components within normal limits  LIPASE, BLOOD  POC URINE PREG, ED     EKG    RADIOLOGY CT abdomen/pelvis: No acute abnormality   I also independently reviewed and agree with radiologist interpretations.   PROCEDURES:  Critical Care performed: No   MEDICATIONS ORDERED IN ED: Medications  morphine (PF) 4 MG/ML injection 4 mg (4 mg Intravenous Given 03/29/23 0058)  ondansetron (ZOFRAN) injection 4 mg (4 mg Intravenous Given 03/29/23 0058)  sodium chloride 0.9 % bolus 1,000 mL (0 mLs Intravenous Stopped 03/29/23 0223)  iohexol (OMNIPAQUE) 300 MG/ML solution 100 mL (100 mLs Intravenous Contrast Given 03/29/23 0116)  ciprofloxacin (CIPRO) tablet 500 mg (500 mg Oral Given 03/29/23 0222)  metroNIDAZOLE (FLAGYL) tablet 500 mg (500 mg Oral Given 03/29/23 0222)     IMPRESSION / MDM / ASSESSMENT AND PLAN / ED COURSE  I reviewed the triage vital signs and the nursing notes.                              Differential diagnosis includes, but is not limited to, acute diverticulitis, enteritis, colitis, intra-abdominal abscess, gastritis, pyelonephritis  Patient's presentation is most consistent with acute presentation with potential threat to life or bodily function.  The patient is on the cardiac monitor to evaluate for evidence of arrhythmia and/or significant heart rate changes  27 year old female with history of recurrent diverticulitis here with mild left-sided abdominal pain and change in bowel habits.  Clinically, suspect early diverticulitis.  CT scan obtained, reviewed, and fortunately shows no evidence of significant abnormality.  No major leukocytosis.  CMP does show  possible mild dehydration.  Note, the patient has had mild transaminitis noted chronically.  She her calcium is elevated, which I suspect is due to dehydration.  Will give fluids and have her follow-up with her PCP for this.  Otherwise, will give empiric antibiotics given extensive history of diverticulitis, discharged with outpatient follow-up.  Return precautions given.   FINAL CLINICAL IMPRESSION(S) / ED DIAGNOSES   Final diagnoses:  Left lower quadrant abdominal pain  Diverticulitis  Hypercalcemia     Rx / DC Orders   ED Discharge Orders          Ordered    ciprofloxacin (CIPRO) 500 MG tablet  2 times daily        03/29/23 0236    metroNIDAZOLE (FLAGYL) 500 MG tablet  3 times daily        03/29/23 0236    ondansetron (ZOFRAN-ODT) 4 MG disintegrating tablet  Every 8 hours PRN        03/29/23 0236             Note:  This document was prepared using Dragon voice recognition software and may include unintentional dictation errors.   Shaune Pollack, MD 03/29/23 [phone]

## 2023-03-29 NOTE — Discharge Instructions (Addendum)
Of note, your calcium was slightly elevated today.  Drink plenty of fluids.  Follow-up with your doctor in the next few weeks to have this rechecked, and for follow-up.  Otherwise, avoid foods very high in calcium, excess Tums/antacids. Drink at least 6-8 glasses of water.

## 2023-06-12 IMAGING — CT CT ABD-PELV W/ CM
2 of 4 series · 16 of 46 positions shown, 18 images · IV contrast (APPLIED)
Comparison: February 11, 2021

CLINICAL DATA: Left upper quadrant pain.

EXAM:
CT ABDOMEN AND PELVIS WITH CONTRAST
TECHNIQUE: Multidetector CT imaging of the abdomen and pelvis was performed
using the standard protocol following bolus administration of
intravenous contrast.
CONTRAST:  100mL OMNIPAQUE IOHEXOL 300 MG/ML  SOLN

[Series 2: axial st · axial · 0.77mm/px · z∈[-421,+9]mm · 13 of 96 slices shown, 15 images]
[im 5/96  soft-tissue]
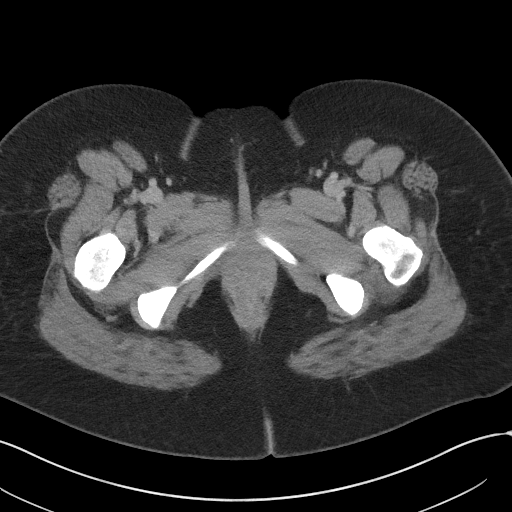
[im 5/96  bone]
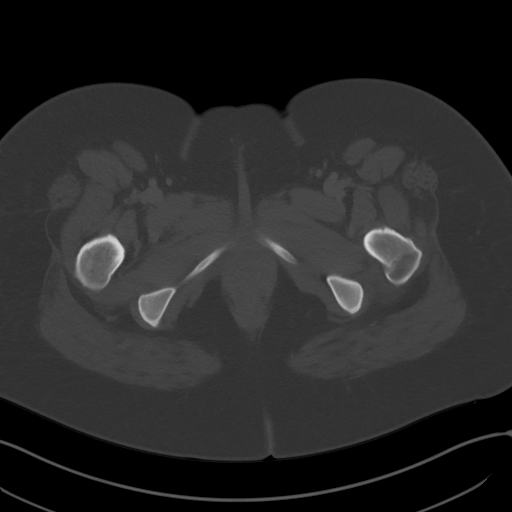
[im 13/96  soft-tissue]
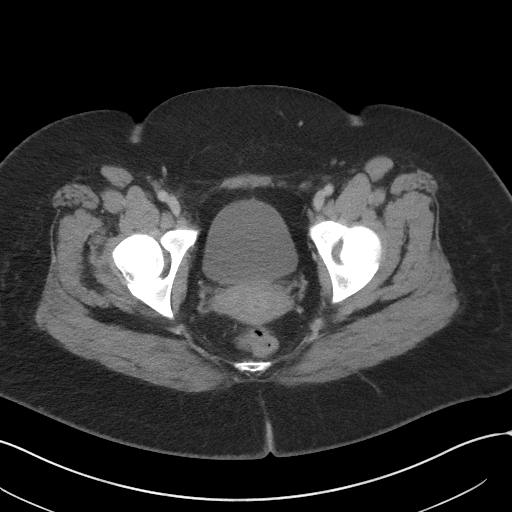
[im 22/96  soft-tissue]
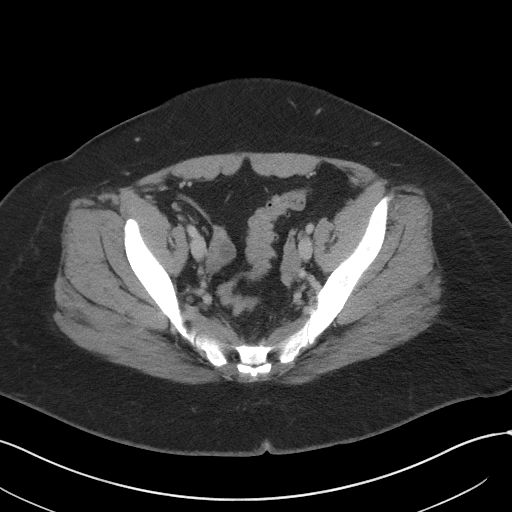
[im 26/96  soft-tissue]
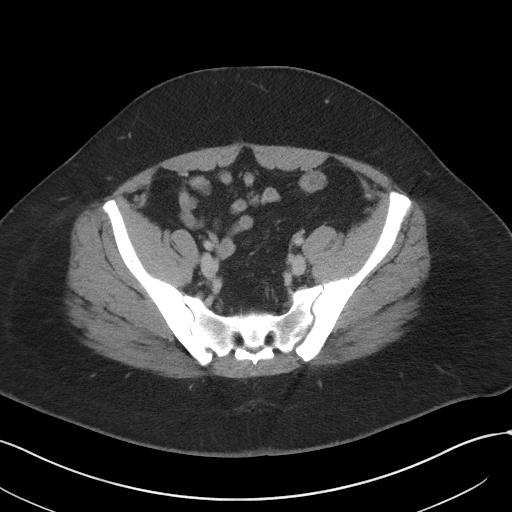
[im 35/96  soft-tissue]
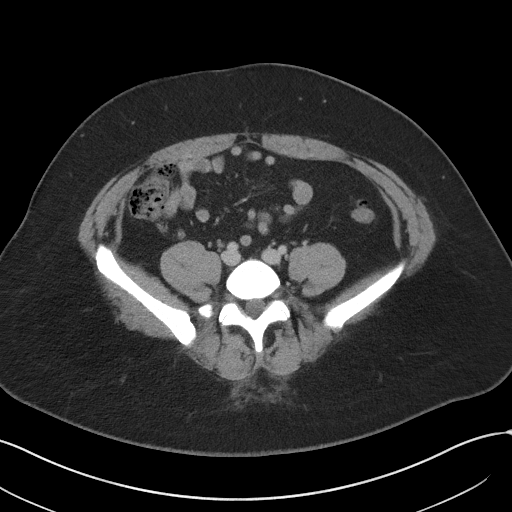
[im 39/96  soft-tissue]
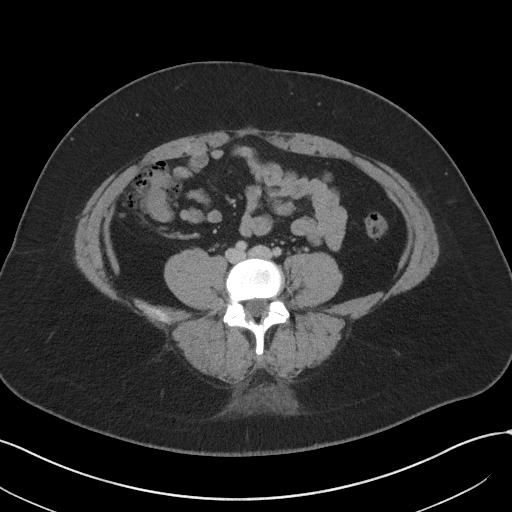
[im 48/96  soft-tissue]
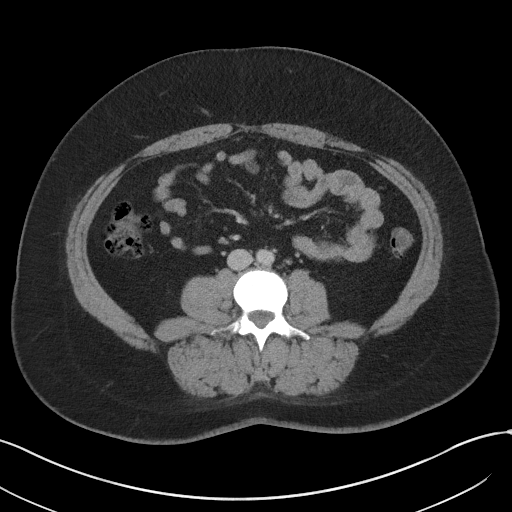
[im 57/96  soft-tissue]
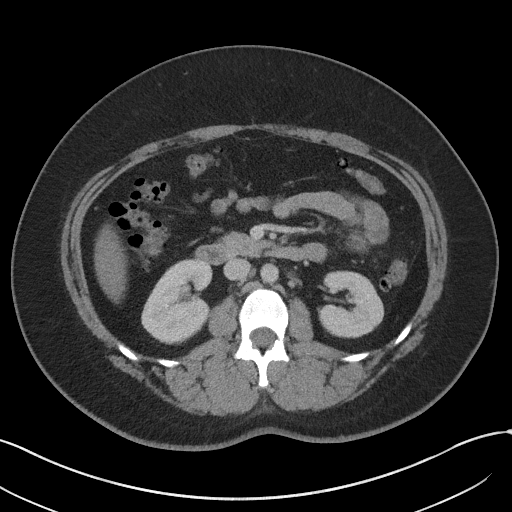
[im 61/96  soft-tissue]
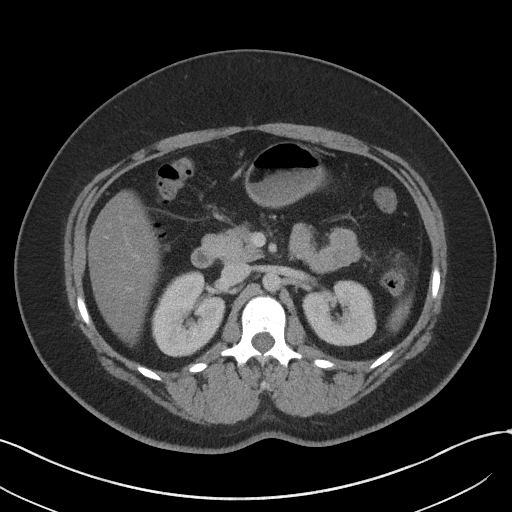
[im 61/96  bone]
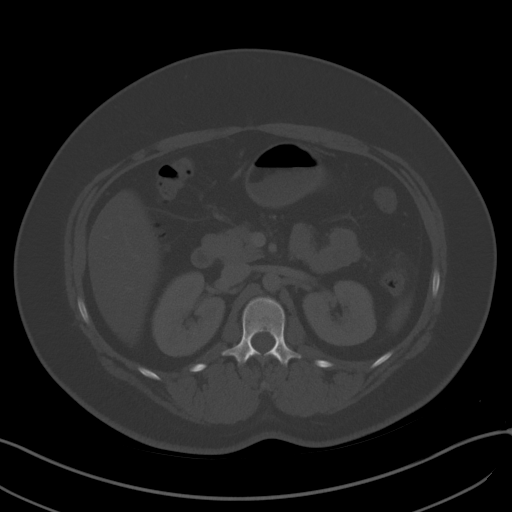
[im 70/96  soft-tissue]
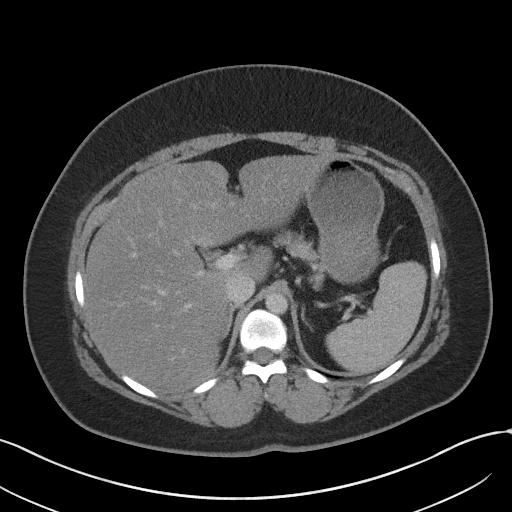
[im 74/96  soft-tissue]
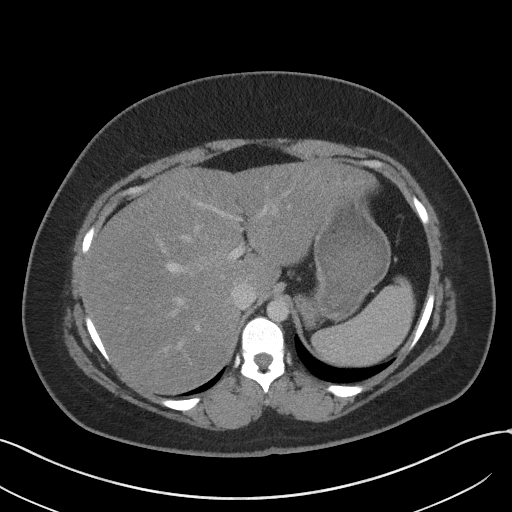
[im 83/96  soft-tissue]
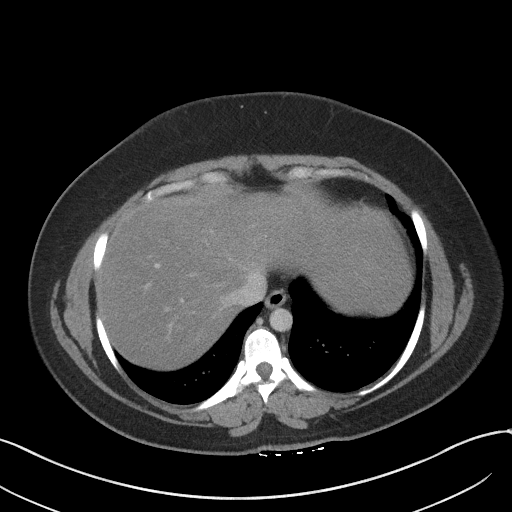
[im 91/96  soft-tissue]
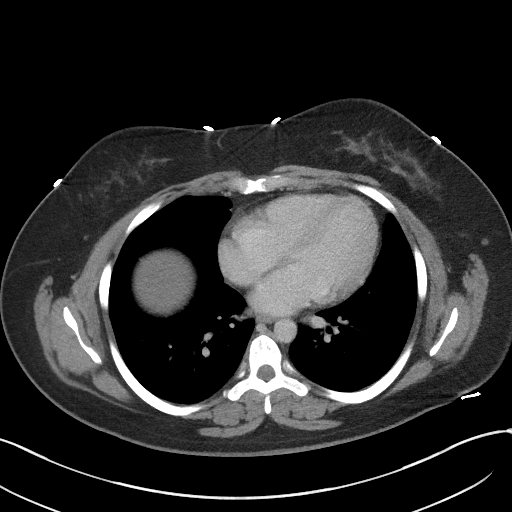

[Series 5: coronal st · coronal · 0.83mm/px · 3 of 104 slices shown]
[im 35/104  soft-tissue]
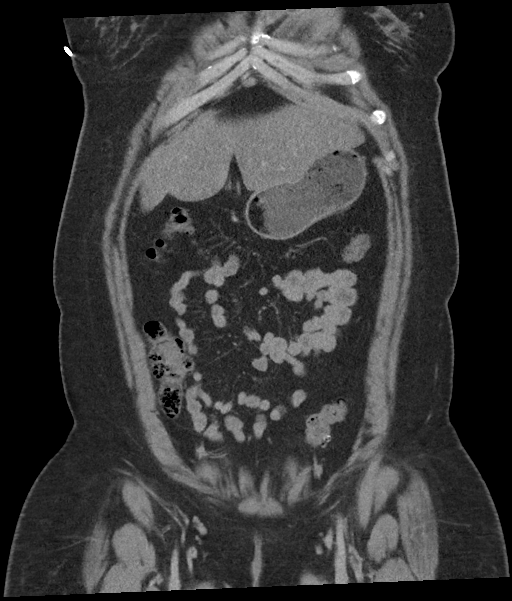
[im 46/104  soft-tissue]
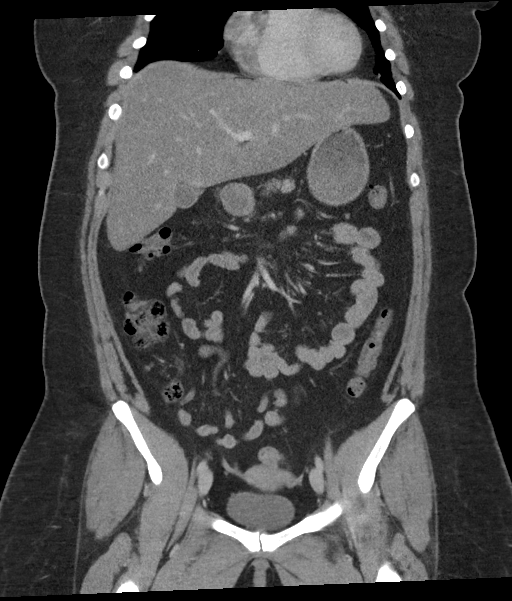
[im 58/104  soft-tissue]
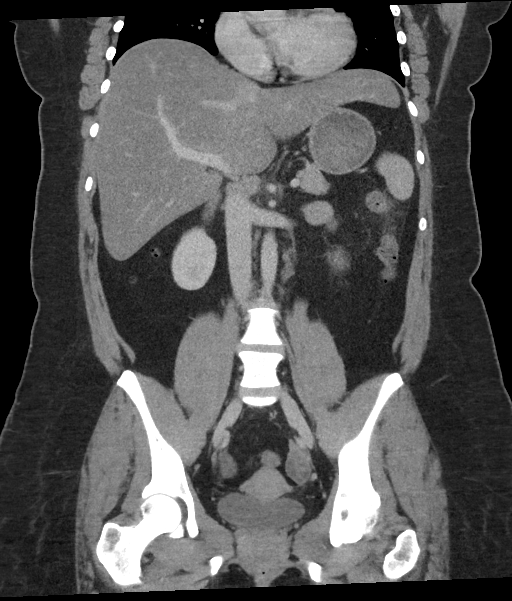

[16 of 46 positions shown; findings below may reference images not displayed]

FINDINGS: Lower chest: No acute abnormality.

Hepatobiliary: There is diffuse fatty infiltration of the liver
parenchyma. No focal liver abnormality is seen. No gallstones,
gallbladder wall thickening, or biliary dilatation.

Pancreas: Unremarkable. No pancreatic ductal dilatation or
surrounding inflammatory changes.

Spleen: Normal in size without focal abnormality.

Adrenals/Urinary Tract: Adrenal glands are unremarkable. Kidneys are
normal, without renal calculi, focal lesion, or hydronephrosis.
Bladder is unremarkable.

Stomach/Bowel: Stomach is within normal limits. Appendix appears
normal. No evidence of bowel wall thickening, distention, or
inflammatory changes. Mildly inflamed diverticula are seen within
the proximal descending colon. There is no evidence of associated
perforation or abscess. Noninflamed diverticula are noted within the
sigmoid colon.

Vascular/Lymphatic: No significant vascular findings are present. No
enlarged abdominal or pelvic lymph nodes.

Reproductive: Uterus and bilateral adnexa are unremarkable.

Other: No abdominal wall hernia or abnormality. No abdominopelvic
ascites.

Musculoskeletal: No acute or significant osseous findings.
IMPRESSION: 1. Mild diverticulitis involving the proximal descending colon
without evidence of associated perforation or abscess.
2. Hepatic steatosis.

## 2023-07-15 ENCOUNTER — Emergency Department
Admission: EM | Admit: 2023-07-15 | Discharge: 2023-07-15 | Disposition: A | Discharge status: Home / Self Care | Payer: Managed Care, Other (non HMO) | Attending: Student in an Organized Health Care Education/Training Program | Admitting: Student in an Organized Health Care Education/Training Program

## 2023-07-15 ENCOUNTER — Other Ambulatory Visit: Payer: Self-pay

## 2023-07-15 ENCOUNTER — Encounter: Payer: Self-pay | Admitting: Emergency Medicine

## 2023-07-15 DIAGNOSIS — R1032 Left lower quadrant pain: Secondary | ICD-10-CM | POA: Insufficient documentation

## 2023-07-15 LAB — URINALYSIS, ROUTINE W REFLEX MICROSCOPIC
Bacteria, UA: NONE SEEN
Bilirubin Urine: NEGATIVE
Glucose, UA: NEGATIVE mg/dL
Ketones, ur: NEGATIVE mg/dL
Leukocytes,Ua: NEGATIVE
Nitrite: NEGATIVE
Protein, ur: NEGATIVE mg/dL
Specific Gravity, Urine: 1.023 (ref 1.005–1.030)
pH: 5 (ref 5.0–8.0)

## 2023-07-15 LAB — CBC
HCT: 43.3 % (ref 36.0–46.0)
Hemoglobin: 14.5 g/dL (ref 12.0–15.0)
MCH: 31.9 pg (ref 26.0–34.0)
MCHC: 33.5 g/dL (ref 30.0–36.0)
MCV: 95.4 fL (ref 80.0–100.0)
Platelets: 283 10*3/uL (ref 150–400)
RBC: 4.54 MIL/uL (ref 3.87–5.11)
RDW: 11.8 % (ref 11.5–15.5)
WBC: 15.2 10*3/uL — ABNORMAL HIGH (ref 4.0–10.5)
nRBC: 0 % (ref 0.0–0.2)

## 2023-07-15 LAB — POC URINE PREG, ED: Preg Test, Ur: NEGATIVE

## 2023-07-15 LAB — LIPASE, BLOOD: Lipase: 25 U/L (ref 11–51)

## 2023-07-15 LAB — COMPREHENSIVE METABOLIC PANEL
ALT: 75 U/L — ABNORMAL HIGH (ref 0–44)
AST: 57 U/L — ABNORMAL HIGH (ref 15–41)
Albumin: 4.4 g/dL (ref 3.5–5.0)
Alkaline Phosphatase: 63 U/L (ref 38–126)
Anion gap: 12 (ref 5–15)
BUN: 9 mg/dL (ref 6–20)
CO2: 22 mmol/L (ref 22–32)
Calcium: 9.1 mg/dL (ref 8.9–10.3)
Chloride: 102 mmol/L (ref 98–111)
Creatinine, Ser: 0.62 mg/dL (ref 0.44–1.00)
GFR, Estimated: >60 mL/min (ref >60)
Glucose, Bld: 93 mg/dL (ref 70–99)
Potassium: 3.9 mmol/L (ref 3.5–5.1)
Sodium: 136 mmol/L (ref 135–145)
Total Bilirubin: 1.3 mg/dL — ABNORMAL HIGH (ref 0.3–1.2)
Total Protein: 8.2 g/dL — ABNORMAL HIGH (ref 6.5–8.1)

## 2023-07-15 MED ORDER — METRONIDAZOLE 500 MG PO TABS: 500.0000 mg | ORAL_TABLET | Freq: Two times a day (BID) | ORAL | 0 refills | Status: DC

## 2023-07-15 MED ORDER — ONDANSETRON HCL 4 MG/2ML IJ SOLN
4.0000 mg | Freq: Once | INTRAMUSCULAR | Status: AC
  Administered 2023-07-15: 4 mg via INTRAVENOUS
  Filled 2023-07-15: qty 2

## 2023-07-15 MED ORDER — CIPROFLOXACIN HCL 500 MG PO TABS: 500.0000 mg | ORAL_TABLET | Freq: Two times a day (BID) | ORAL | 0 refills | Status: AC

## 2023-07-15 MED ORDER — CIPROFLOXACIN HCL 500 MG PO TABS: 500.0000 mg | ORAL_TABLET | Freq: Two times a day (BID) | ORAL | 0 refills | Status: DC

## 2023-07-15 MED ORDER — METRONIDAZOLE 500 MG PO TABS: 500.0000 mg | ORAL_TABLET | Freq: Two times a day (BID) | ORAL | 0 refills | Status: AC

## 2023-07-15 MED ORDER — METRONIDAZOLE 500 MG PO TABS
500.0000 mg | ORAL_TABLET | Freq: Once | ORAL | Status: AC
  Administered 2023-07-15: 500 mg via ORAL
  Filled 2023-07-15: qty 1

## 2023-07-15 MED ORDER — CIPROFLOXACIN HCL 500 MG PO TABS
500.0000 mg | ORAL_TABLET | Freq: Once | ORAL | Status: AC
  Administered 2023-07-15: 500 mg via ORAL
  Filled 2023-07-15: qty 1

## 2023-07-15 MED ORDER — MORPHINE SULFATE (PF) 4 MG/ML IV SOLN
4.0000 mg | INTRAVENOUS | Status: DC | PRN
  Administered 2023-07-15: 4 mg via INTRAVENOUS
  Filled 2023-07-15: qty 1

## 2023-07-15 NOTE — ED Notes (Signed)
This RN entered pt's room and informed pt of needing to started IV and draw blood. Pt not answering RN. RN stated pt's name 3 different times and ask to be able to start line. Pt jerked covers back and stated leave me alone I just want to sleep. RN Marcine Matar and MD Roxan Hockey informed of situation.

## 2023-07-15 NOTE — ED Provider Notes (Signed)
[facility] Provider Note    Event Date/Time   First MD Initiated Contact with Patient 07/15/23 1111     (approximate)   History   Abdominal Pain   HPI  Carl Bleecker is a 27 y.o. female with a history of diverticular disease presents to the ER for evaluation of left lower quadrant abdominal pain over the past 24 hours.  States it gets worse particular when she is eating a lot of red meat which she has over the past few days.  Denies any fevers or chills.  No dysuria no hematuria.     Physical Exam   Triage Vital Signs: ED Triage Vitals  Encounter Vitals Group     BP 07/15/23 1036 (!) 145/99     Systolic BP Percentile --      Diastolic BP Percentile --      Pulse Rate 07/15/23 1036 (!) 111     Resp 07/15/23 1036 20     Temp 07/15/23 1036 98.6 F (37 C)     Temp Source 07/15/23 1036 Oral     SpO2 07/15/23 1036 98 %     Weight 07/15/23 1034 220 lb (99.8 kg)     Height 07/15/23 1034 5\' 1"  (1.549 m)     Head Circumference --      Peak Flow --      Pain Score 07/15/23 1034 8     Pain Loc --      Pain Education --      Exclude from Growth Chart --     Most recent vital signs: Vitals:   07/15/23 1036  BP: (!) 145/99  Pulse: (!) 111  Resp: 20  Temp: 98.6 F (37 C)  SpO2: 98%     Constitutional: Alert  Eyes: Conjunctivae are normal.  Head: Atraumatic. Nose: No congestion/rhinnorhea. Mouth/Throat: Mucous membranes are moist.   Neck: Painless ROM.  Cardiovascular:   Good peripheral circulation. Respiratory: Normal respiratory effort.  No retractions.  Gastrointestinal: Soft and nontender.  Musculoskeletal:  no deformity Neurologic:  MAE spontaneously. No gross focal neurologic deficits are appreciated.  Skin:  Skin is warm, dry and intact. No rash noted. Psychiatric: Mood and affect are normal. Speech and behavior are normal.    ED Results / Procedures / Treatments   Labs (all labs ordered are listed, but only abnormal results  are displayed) Labs Reviewed  COMPREHENSIVE METABOLIC PANEL - Abnormal; Notable for the following components:      Result Value   Total Protein 8.2 (*)    AST 57 (*)    ALT 75 (*)    Total Bilirubin 1.3 (*)    All other components within normal limits  CBC - Abnormal; Notable for the following components:   WBC 15.2 (*)    All other components within normal limits  URINALYSIS, ROUTINE W REFLEX MICROSCOPIC - Abnormal; Notable for the following components:   Color, Urine YELLOW (*)    APPearance HAZY (*)    Hgb urine dipstick MODERATE (*)    All other components within normal limits  LIPASE, BLOOD  POC URINE PREG, ED     EKG  RADIOLOGY     PROCEDURES:  Critical Care performed:   Procedures   MEDICATIONS ORDERED IN ED: Medications  ciprofloxacin (CIPRO) tablet 500 mg (has no administration in time range)  metroNIDAZOLE (FLAGYL) tablet 500 mg (has no administration in time range)  morphine (PF) 4 MG/ML injection 4 mg (has no administration in time range)  ondansetron (ZOFRAN) injection 4 mg (has no administration in time range)     IMPRESSION / MDM / ASSESSMENT AND PLAN / ED COURSE  I reviewed the triage vital signs and the nursing notes.                              Differential diagnosis includes, but is not limited to, reticulitis, lightest, stone, UTI, pregnancy, constipation, musculoskeletal strain  Patient presenting to the ER for evaluation of symptoms as described above.  Based on symptoms, risk factors and considered above differential, this presenting complaint could reflect a potentially life-threatening illness therefore the patient will be placed on continuous pulse oximetry and telemetry for monitoring.  Laboratory evaluation will be sent to evaluate for the above complaints.  Patient with benign abdominal exam no guarding or rebound.  Has had multiple bouts of diverticulitis in the past and this is a very mild case for her.  Given this history with  left-sided pain no fever significant tachycardia or signs of more serious illness at this time we discussed option for diagnostic imaging versus trial of outpatient antibiotics.  Patient would prefer trial of outpatient antibiotics and I think that that is reasonable and a safe option at this time.  She is tolerating p.o.  Pain controlled in the ER.  She seems reliable and agrees to return if her symptoms worsen.       FINAL CLINICAL IMPRESSION(S) / ED DIAGNOSES   Final diagnoses:  Left lower quadrant abdominal pain     Rx / DC Orders   ED Discharge Orders          Ordered    ciprofloxacin (CIPRO) 500 MG tablet  2 times daily,   Status:  Discontinued        07/15/23 1236    metroNIDAZOLE (FLAGYL) 500 MG tablet  2 times daily,   Status:  Discontinued        07/15/23 1236    metroNIDAZOLE (FLAGYL) 500 MG tablet  2 times daily,   Status:  Discontinued        07/15/23 1236    ciprofloxacin (CIPRO) 500 MG tablet  2 times daily,   Status:  Discontinued        07/15/23 1236    metroNIDAZOLE (FLAGYL) 500 MG tablet  2 times daily,   Status:  Discontinued        07/15/23 1237    ciprofloxacin (CIPRO) 500 MG tablet  2 times daily        07/15/23 1238    metroNIDAZOLE (FLAGYL) 500 MG tablet  2 times daily        07/15/23 1238             Note:  This document was prepared using Dragon voice recognition software and may include unintentional dictation errors.    Willy Eddy, MD 07/15/23 1240

## 2023-07-15 NOTE — ED Triage Notes (Signed)
Pt via POV from home. Pt c/o LLQ pain with nausea and diarrhea since last night. Denies any urinary symptoms. Pt has a hx of diverticulitis. States that she ate steak yesterday and that usually causes her issues. Pt is A&Ox4 and NAD, ambulatory to triage.   Pt states that she is a difficult stick and request if she could wait for blood work when she gets the IV.

## 2023-07-15 NOTE — Discharge Instructions (Addendum)
You have been seen in the emergency department for emergency care. It is important that you contact your own doctor, specialist or the closest clinic for follow-up care. Please bring this instruction sheet, all medications and X-ray copies with you when you are seen for follow-up care.  Determining the exact cause for all patients with abdominal pain is extremely difficult in the emergency department. Our primary focus is to rule-out immediate life-threatening diseases. If no immediate source of pain is found the definitive diagnosis frequently needs to be determined over time.Many times your primary care physician can determine the cause by following the symptoms over time. Sometimes, specialist are required such as Gastroenterologists, Gynecologists, Urologists or Surgeons. Please return immediately to the Emergency Department for fever>101, Vomiting or Intractable Pain. You should return to the emergency department or see your primary care provider in 12-24hrs if your pain is no better and sooner if your pain becomes worse.

## 2023-10-10 ENCOUNTER — Emergency Department: Payer: Managed Care, Other (non HMO)

## 2023-10-10 ENCOUNTER — Emergency Department
Admission: EM | Admit: 2023-10-10 | Discharge: 2023-10-10 | Disposition: A | Discharge status: Home / Self Care | Payer: Managed Care, Other (non HMO) | Attending: Emergency Medicine | Admitting: Emergency Medicine

## 2023-10-10 ENCOUNTER — Other Ambulatory Visit: Payer: Self-pay

## 2023-10-10 DIAGNOSIS — K5792 Diverticulitis of intestine, part unspecified, without perforation or abscess without bleeding: Secondary | ICD-10-CM | POA: Diagnosis not present

## 2023-10-10 DIAGNOSIS — R1011 Right upper quadrant pain: Secondary | ICD-10-CM | POA: Diagnosis present

## 2023-10-10 LAB — COMPREHENSIVE METABOLIC PANEL
ALT: 112 U/L — ABNORMAL HIGH (ref 0–44)
AST: 116 U/L — ABNORMAL HIGH (ref 15–41)
Albumin: 4.2 g/dL (ref 3.5–5.0)
Alkaline Phosphatase: 55 U/L (ref 38–126)
Anion gap: 15 (ref 5–15)
BUN: 12 mg/dL (ref 6–20)
CO2: 19 mmol/L — ABNORMAL LOW (ref 22–32)
Calcium: 9.6 mg/dL (ref 8.9–10.3)
Chloride: 105 mmol/L (ref 98–111)
Creatinine, Ser: 0.68 mg/dL (ref 0.44–1.00)
GFR, Estimated: >60 mL/min (ref >60)
Glucose, Bld: 131 mg/dL — ABNORMAL HIGH (ref 70–99)
Potassium: 4.1 mmol/L (ref 3.5–5.1)
Sodium: 139 mmol/L (ref 135–145)
Total Bilirubin: 1 mg/dL (ref 0.0–1.2)
Total Protein: 8.4 g/dL — ABNORMAL HIGH (ref 6.5–8.1)

## 2023-10-10 LAB — CBC
HCT: 44.8 % (ref 36.0–46.0)
Hemoglobin: 15.1 g/dL — ABNORMAL HIGH (ref 12.0–15.0)
MCH: 32.8 pg (ref 26.0–34.0)
MCHC: 33.7 g/dL (ref 30.0–36.0)
MCV: 97.2 fL (ref 80.0–100.0)
Platelets: 280 10*3/uL (ref 150–400)
RBC: 4.61 MIL/uL (ref 3.87–5.11)
RDW: 11.9 % (ref 11.5–15.5)
WBC: 10.2 10*3/uL (ref 4.0–10.5)
nRBC: 0 % (ref 0.0–0.2)

## 2023-10-10 LAB — URINALYSIS, ROUTINE W REFLEX MICROSCOPIC
Bilirubin Urine: NEGATIVE
Glucose, UA: NEGATIVE mg/dL
Ketones, ur: 5 mg/dL — AB
Nitrite: NEGATIVE
Protein, ur: 30 mg/dL — AB
Specific Gravity, Urine: 1.031 — ABNORMAL HIGH (ref 1.005–1.030)
pH: 5 (ref 5.0–8.0)

## 2023-10-10 LAB — LIPASE, BLOOD: Lipase: 24 U/L (ref 11–51)

## 2023-10-10 LAB — POC URINE PREG, ED: Preg Test, Ur: NEGATIVE

## 2023-10-10 MED ORDER — ONDANSETRON HCL 4 MG/2ML IJ SOLN
4.0000 mg | Freq: Once | INTRAMUSCULAR | Status: AC
  Administered 2023-10-10: 4 mg via INTRAVENOUS
  Filled 2023-10-10: qty 2

## 2023-10-10 MED ORDER — LACTATED RINGERS IV BOLUS
1000.0000 mL | Freq: Once | INTRAVENOUS | Status: AC
Start: 2023-10-10 — End: 2023-10-10
  Administered 2023-10-10: 1000 mL via INTRAVENOUS

## 2023-10-10 MED ORDER — AMOXICILLIN-POT CLAVULANATE 875-125 MG PO TABS: 1.0000 | ORAL_TABLET | Freq: Two times a day (BID) | ORAL | 0 refills | Status: AC

## 2023-10-10 MED ORDER — MORPHINE SULFATE (PF) 4 MG/ML IV SOLN
4.0000 mg | Freq: Once | INTRAVENOUS | Status: AC
  Administered 2023-10-10: 4 mg via INTRAVENOUS
  Filled 2023-10-10: qty 1

## 2023-10-10 MED ORDER — HYDROCODONE-ACETAMINOPHEN 5-325 MG PO TABS: 1.0000 | ORAL_TABLET | ORAL | 0 refills | Status: DC | PRN

## 2023-10-10 MED ORDER — IOHEXOL 300 MG/ML  SOLN
100.0000 mL | Freq: Once | INTRAMUSCULAR | Status: AC | PRN
  Administered 2023-10-10: 100 mL via INTRAVENOUS

## 2023-10-10 MED ORDER — HYDROCODONE-ACETAMINOPHEN 5-325 MG PO TABS
1.0000 | ORAL_TABLET | Freq: Once | ORAL | Status: AC
  Administered 2023-10-10: 1 via ORAL
  Filled 2023-10-10: qty 1

## 2023-10-10 MED ORDER — ONDANSETRON 4 MG PO TBDP: 4.0000 mg | ORAL_TABLET | Freq: Three times a day (TID) | ORAL | 0 refills | Status: DC | PRN

## 2023-10-10 NOTE — ED Provider Notes (Signed)
 [facility] Provider Note    Event Date/Time   First MD Initiated Contact with Patient 10/10/23 [phone]     (approximate)   History   Chief Complaint Abdominal Pain   HPI  Leah Baxter is a 28 y.o. female with no significant past medical history who presents to the ED complaining of abdominal pain.  Patient reports that she has had about 4 days of increasing pain across both sides of her lower abdomen, will occasionally feel like the pain is in the right upper quadrant of her abdomen.  She denies any associated fevers, has had nausea, vomiting, with a small amount of diarrhea.  She denies any dysuria or flank pain, but has noticed some recent vaginal bleeding that she describes as dark.  She describes similar symptoms in the past with diverticulitis, has never had surgery on her abdomen.     Physical Exam   Triage Vital Signs: ED Triage Vitals  Encounter Vitals Group     BP 10/10/23 0452 (!) 161/113     Systolic BP Percentile --      Diastolic BP Percentile --      Pulse Rate 10/10/23 0451 (!) 119     Resp 10/10/23 0451 20     Temp 10/10/23 0451 97.9 F (36.6 C)     Temp Source 10/10/23 0451 Oral     SpO2 10/10/23 0451 95 %     Weight 10/10/23 0450 225 lb (102.1 kg)     Height 10/10/23 0450 5' 1 (1.549 m)     Head Circumference --      Peak Flow --      Pain Score 10/10/23 0450 7     Pain Loc --      Pain Education --      Exclude from Growth Chart --     Most recent vital signs: Vitals:   10/10/23 0452 10/10/23 0715  BP: (!) 161/113 (!) 153/101  Pulse:  (!) 103  Resp:  18  Temp:    SpO2:  98%    Constitutional: Alert and oriented. Eyes: Conjunctivae are normal. Head: Atraumatic. Nose: No congestion/rhinnorhea. Mouth/Throat: Mucous membranes are moist.  Cardiovascular: Normal rate, regular rhythm. Grossly normal heart sounds.  2+ radial pulses bilaterally. Respiratory: Normal respiratory effort.  No retractions. Lungs  CTAB. Gastrointestinal: Soft and diffusely tender to palpation with no rebound or guarding. No distention. Musculoskeletal: No lower extremity tenderness nor edema.  Neurologic:  Normal speech and language. No gross focal neurologic deficits are appreciated.    ED Results / Procedures / Treatments   Labs (all labs ordered are listed, but only abnormal results are displayed) Labs Reviewed  URINALYSIS, ROUTINE W REFLEX MICROSCOPIC - Abnormal; Notable for the following components:      Result Value   Color, Urine AMBER (*)    APPearance CLOUDY (*)    Specific Gravity, Urine 1.031 (*)    Hgb urine dipstick LARGE (*)    Ketones, ur 5 (*)    Protein, ur 30 (*)    Leukocytes,Ua MODERATE (*)    Bacteria, UA FEW (*)    All other components within normal limits  CBC - Abnormal; Notable for the following components:   Hemoglobin 15.1 (*)    All other components within normal limits  COMPREHENSIVE METABOLIC PANEL - Abnormal; Notable for the following components:   CO2 19 (*)    Glucose, Bld 131 (*)    Total Protein 8.4 (*)    AST 116 (*)  ALT 112 (*)    All other components within normal limits  LIPASE, BLOOD  POC URINE PREG, ED   RADIOLOGY CT abdomen/pelvis reviewed and interpreted by me with inflammatory changes of the transverse colon, no focal fluid collections or dilated bowel loops.  PROCEDURES:  Critical Care performed: No  Procedures   MEDICATIONS ORDERED IN ED: Medications  ondansetron  (ZOFRAN ) injection 4 mg (4 mg Intravenous Given 10/10/23 0644)  morphine  (PF) 4 MG/ML injection 4 mg (4 mg Intravenous Given 10/10/23 0743)  lactated ringers  bolus 1,000 mL (1,000 mLs Intravenous New Bag/Given 10/10/23 0745)  iohexol  (OMNIPAQUE ) 300 MG/ML solution 100 mL (100 mLs Intravenous Contrast Given 10/10/23 0754)     IMPRESSION / MDM / ASSESSMENT AND PLAN / ED COURSE  I reviewed the triage vital signs and the nursing notes.                              28 y.o. female with past  medical history of diverticulitis who presents to the ED complaining of diffuse abdominal pain for the past 4 days associated with nausea, vomiting, and diarrhea.  Patient's presentation is most consistent with acute presentation with potential threat to life or bodily function.  Differential diagnosis includes, but is not limited to, diverticulitis, intra-abdominal abscess, bowel obstruction, kidney stone, UTI, gastroenteritis, colitis, cholecystitis, biliary colic, hepatitis, pancreatitis, Fitz-Hugh Curtis syndrome, PID.  Patient nontoxic-appearing and in no acute distress, vital signs remarkable for mild tachycardia but otherwise reassuring.  Labs without significant anemia or leukocytosis, electrolytes unremarkable and no AKI noted.  She does have a mild transaminitis but LFTs are otherwise reassuring, lipase within normal limits.  Pregnancy testing is negative, urinalysis appears contaminated but no urinary symptoms to suggest UTI.  We will treat symptomatically with IV morphine  and Zofran , hydrate with IV fluids and further assess with CT imaging.  CT imaging consistent with uncomplicated diverticulitis of the transverse colon.  Patient's transaminitis likely due to this, no findings concerning for biliary obstruction.  She is feeling better on reassessment and tolerating oral intake without difficulty.  She is appropriate for discharge home with outpatient follow-up with her PCP and GI, was counseled to return to the ED for new or worsening symptoms.  Patient agrees with plan.      FINAL CLINICAL IMPRESSION(S) / ED DIAGNOSES   Final diagnoses:  Diverticulitis     Rx / DC Orders   ED Discharge Orders          Ordered    ondansetron  (ZOFRAN -ODT) 4 MG disintegrating tablet  Every 8 hours PRN        10/10/23 0855    amoxicillin -clavulanate (AUGMENTIN ) 875-125 MG tablet  2 times daily        10/10/23 0855    HYDROcodone -acetaminophen  (NORCO/VICODIN) 5-325 MG tablet  Every 4 hours PRN         10/10/23 0855             Note:  This document was prepared using Dragon voice recognition software and may include unintentional dictation errors.   Willo Dunnings, MD 10/10/23 [phone]

## 2023-10-10 NOTE — ED Triage Notes (Signed)
 Pt reports abd pain x few days, with n/v/d. Pt states she has hx diverticulitis.

## 2023-10-10 NOTE — ED Notes (Signed)
 Pt requesting additional dose of morphine before being discharged. Dr Larinda Buttery notified.

## 2023-10-10 NOTE — ED Notes (Addendum)
 See triage note, pt reports generalized abd pain for over a week. +nausea. States thinks having diverticulitis flare. +burning with urination. Reports had leftover antibiotics at home that she took

## 2023-10-10 NOTE — ED Notes (Signed)
 Pt to CT

## 2023-10-26 ENCOUNTER — Ambulatory Visit: Payer: Managed Care, Other (non HMO) | Admitting: Nurse Practitioner

## 2023-10-26 ENCOUNTER — Encounter: Payer: Self-pay | Admitting: Nurse Practitioner

## 2023-10-26 VITALS — BP 134/86 | HR 100 | Temp 98.5°F | Ht 61.75 in | Wt 220.5 lb

## 2023-10-26 DIAGNOSIS — Z131 Encounter for screening for diabetes mellitus: Secondary | ICD-10-CM

## 2023-10-26 DIAGNOSIS — R7989 Other specified abnormal findings of blood chemistry: Secondary | ICD-10-CM | POA: Diagnosis not present

## 2023-10-26 DIAGNOSIS — K5792 Diverticulitis of intestine, part unspecified, without perforation or abscess without bleeding: Secondary | ICD-10-CM

## 2023-10-26 DIAGNOSIS — Z0000 Encounter for general adult medical examination without abnormal findings: Secondary | ICD-10-CM | POA: Diagnosis not present

## 2023-10-26 DIAGNOSIS — Z13220 Encounter for screening for lipoid disorders: Secondary | ICD-10-CM

## 2023-10-26 LAB — COMPREHENSIVE METABOLIC PANEL
ALT: 53 U/L — ABNORMAL HIGH (ref 0–35)
AST: 42 U/L — ABNORMAL HIGH (ref 0–37)
Albumin: 4.8 g/dL (ref 3.5–5.2)
Alkaline Phosphatase: 58 U/L (ref 39–117)
BUN: 8 mg/dL (ref 6–23)
CO2: 23 meq/L (ref 19–32)
Calcium: 10 mg/dL (ref 8.4–10.5)
Chloride: 104 meq/L (ref 96–112)
Creatinine, Ser: 0.61 mg/dL (ref 0.40–1.20)
GFR: 121.07 mL/min (ref >60.00)
Glucose, Bld: 78 mg/dL (ref 70–99)
Potassium: 4.8 meq/L (ref 3.5–5.1)
Sodium: 138 meq/L (ref 135–145)
Total Bilirubin: 0.6 mg/dL (ref 0.2–1.2)
Total Protein: 7.6 g/dL (ref 6.0–8.3)

## 2023-10-26 LAB — LIPID PANEL
Cholesterol: 185 mg/dL (ref 0–200)
HDL: 50.2 mg/dL (ref >39.00)
LDL Cholesterol: 113 mg/dL — ABNORMAL HIGH (ref 0–99)
NonHDL: 135.28
Total CHOL/HDL Ratio: 4
Triglycerides: 112 mg/dL (ref 0.0–149.0)
VLDL: 22.4 mg/dL (ref 0.0–40.0)

## 2023-10-26 LAB — TSH: TSH: 1.64 u[IU]/mL (ref 0.35–5.50)

## 2023-10-26 LAB — HEMOGLOBIN A1C: Hgb A1c MFr Bld: 5.6 % (ref 4.6–6.5)

## 2023-10-26 MED ORDER — ZEPBOUND 2.5 MG/0.5ML ~~LOC~~ SOAJ: 2.5000 mg | SUBCUTANEOUS | 0 refills | Status: DC

## 2023-10-26 NOTE — Assessment & Plan Note (Signed)
Recent ED visit for diverticulitis showed increased LFTs pending CMP today

## 2023-10-26 NOTE — Assessment & Plan Note (Addendum)
Pending TSH, A1c, lipid panel today.  Patient was given medical recommendation of 30 minutes of exercise 5 times a week.  We will start slow with 10 minutes 1-2 times a week with walking.  Patient mentions that she has a walking plan that she can start doing this at home. Discussed medically assisted weight loss.  Patient has used phentermine in the past.  She will check with her insurance to see if Zepbound or Reginal Lutes is covered.  Did discuss common side effects of medication and that this is not safe during pregnancy.

## 2023-10-26 NOTE — Progress Notes (Signed)
New Patient Office Visit  Subjective    Patient ID: Leah Baxter, female    DOB: [birth year]  Age: 28 y.o. MRN: [id number]  CC:  Chief Complaint  Patient presents with   Establish Care    Discuss weight/ loss     HPI [name] presents to establish care   for complete physical and follow up of chronic conditions.   Weight loss: states that she has been unsucessful in the past. She has tried phentramine in the past and lost weight. States that she has had trouble getting the medicaoitn. States that she has been taking care of her grandmonther. States that she has been focusing on herself for the past 10 months. States that she will struggle with exercise and then when she sees that she has not losing weight she will binge eat. Exercise: not currenlty exercising. States that she does have sedentary job. She will clean the house and that is her activity currently     Immunizations: -Tetanus: needs updating, deferred today -Influenza: refused -Shingles: too young -Pneumonia: too young     Diet:  See above Exercise: No regular exercise. See above  Eye exam: PRN Dental exam: Completes semi-annually    Colonoscopy: too young, currently average risk   Lung Cancer Screening: NA   Pap smear: feb 2024, followed by GYN  Sleep: bed time is 9 and she will get up at 6am. Feels rested and does not snore unless she drinks.     Outpatient Encounter Medications as of 10/26/2023  Medication Sig   [DISCONTINUED] buPROPion ER (WELLBUTRIN SR) 100 MG 12 hr tablet Take 100 mg by mouth daily.   [DISCONTINUED] HYDROcodone-acetaminophen (NORCO/VICODIN) 5-325 MG tablet Take 1 tablet by mouth every 4 (four) hours as needed for moderate pain (pain score 4-6).   [DISCONTINUED] ondansetron (ZOFRAN-ODT) 4 MG disintegrating tablet Take 1 tablet (4 mg total) by mouth every 8 (eight) hours as needed for nausea or vomiting.   [DISCONTINUED] phentermine (ADIPEX-P) 37.5 MG tablet Take 37.5 mg by mouth  daily.   [DISCONTINUED] traMADol (ULTRAM) 50 MG tablet Take 1 tablet (50 mg total) by mouth every 6 (six) hours as needed.   No facility-administered encounter medications on file as of 10/26/2023.    Past Medical History:  Diagnosis Date   Anxiety    Depression    History of colon polyps    History of diverticulitis    History of hypertension    History of UTI     Past Surgical History:  Procedure Laterality Date   COLONOSCOPY N/A 03/20/2022   Procedure: COLONOSCOPY;  Surgeon: Midge Minium, MD;  Location: [facility];  Service: Endoscopy;  Laterality: N/A;    Family History  Problem Relation Age of Onset   Hyperlipidemia Mother    Hypertension Mother    Alcohol abuse Father    Early death Sister    Drug abuse Sister    Depression Sister    Alcohol abuse Sister    Mental illness Sister    Depression Sister    Diabetes Maternal Grandmother    Heart attack Maternal Grandfather    Heart disease Paternal Grandmother    Heart attack Paternal Grandmother    Hearing loss Paternal Grandmother     Social History   Socioeconomic History   Marital status: Single    Spouse name: Not on file   Number of children: 0   Years of education: Not on file   Highest education level: Not on file  Occupational History   Not on file  Tobacco Use   Smoking status: Never   Smokeless tobacco: Never  Vaping Use   Vaping status: Never Used  Substance and Sexual Activity   Alcohol use: Yes    Comment: rare   Drug use: Never   Sexual activity: Yes  Other Topics Concern   Not on file  Social History Narrative   Fulltime: Therapist, music for [employer]      Social Drivers of Health   Financial Resource Strain: Patient Declined (03/14/2023)   Received from [facility], [facility]   Overall Financial Resource Strain (CARDIA)    Difficulty of Paying Living Expenses: Patient declined  Food Insecurity: Patient Declined (03/14/2023)   Received from [facility], [facility]   Hunger Vital Sign    Worried About Running Out of Food in the Last Year: Patient declined    Ran Out of Food in the Last Year: Patient declined  Transportation Needs: Patient Declined (03/14/2023)   Received from [facility], [facility]   PRAPARE - Transportation    In the past 12 months, has lack of transportation kept you from medical appointments or from getting medications?: Patient declined    Lack of Transportation (Non-Medical): Patient declined  Physical Activity: Not on file  Stress: Not on file  Social Connections: Not on file  Intimate Partner Violence: Not on file    Review of Systems  Constitutional:  Negative for chills and fever.  Respiratory:  Negative for shortness of breath.   Cardiovascular:  Negative for chest pain and leg swelling.  Gastrointestinal:  Negative for abdominal pain, blood in stool, constipation, diarrhea, nausea and vomiting.       BM daily   Genitourinary:  Negative for dysuria and hematuria.  Neurological:  Negative for tingling and headaches.  Psychiatric/Behavioral:  Negative for hallucinations and suicidal ideas.         Objective    BP 134/86 (BP Location: Left Arm, Patient Position: Sitting, Cuff Size: Large)   Pulse 100   Temp 98.5 F (36.9 C) (Oral)   Ht 5' 1.75" (1.568 m)   Wt 220 lb 8 oz (100 kg)   LMP 10/09/2023   SpO2 99%   BMI 40.66 kg/m   Physical Exam Vitals and nursing note reviewed.  Constitutional:      Appearance: Normal appearance.  HENT:     Right Ear: Tympanic membrane, ear canal and external ear normal.     Left Ear: Tympanic membrane, ear canal and external ear normal.     Mouth/Throat:     Mouth: Mucous membranes are moist.     Pharynx: Oropharynx is clear.  Eyes:     Extraocular Movements: Extraocular movements intact.     Pupils: Pupils are equal, round, and reactive to light.  Cardiovascular:     Rate and  Rhythm: Normal rate and regular rhythm.     Pulses: Normal pulses.     Heart sounds: Normal heart sounds.  Pulmonary:     Effort: Pulmonary effort is normal.     Breath sounds: Normal breath sounds.  Abdominal:     General: Bowel sounds are normal. There is no distension.     Palpations: There is no mass.     Tenderness: There is no abdominal tenderness.     Hernia: No hernia is present.  Musculoskeletal:     Right lower leg: No edema.  Left lower leg: No edema.  Lymphadenopathy:     Cervical: No cervical adenopathy.  Skin:    General: Skin is warm.  Neurological:     General: No focal deficit present.     Mental Status: She is alert.     Deep Tendon Reflexes:     Reflex Scores:      Bicep reflexes are 2+ on the right side and 2+ on the left side.      Patellar reflexes are 2+ on the right side and 2+ on the left side.    Comments: Bilateral upper and lower extremity strength 5/5  Psychiatric:        Mood and Affect: Mood normal.        Behavior: Behavior normal.        Thought Content: Thought content normal.        Judgment: Judgment normal.         Assessment & Plan:   Problem List Items Addressed This Visit       Other   Diverticulitis   History of same as recurrent.  Patient has been followed by GI and underwent colonoscopy.  States she gets it 3-4 times a year.      Preventative health care - Primary   Discussed immunizations and screening exams.  Did review patient's personal, surgical, social, family histories.  Patient is up-to-date on all age-appropriate vaccinations she would like.  Patient refused flu vaccine and tetanus vaccine today.  Patient too young for CRC screening or breast cancer screening.  Patient is up-to-date with cervical cancer screening through her GYN.  Patient was given information at discharge about preventative healthcare maintenance with anticipatory guidance.      Morbid obesity (HCC)   Pending TSH, A1c, lipid panel today.   Patient was given medical recommendation of 30 minutes of exercise 5 times a week.  We will start slow with 10 minutes 1-2 times a week with walking.  Patient mentions that she has a walking plan that she can start doing this at home. Discussed medically assisted weight loss.  Patient has used phentermine in the past.  She will check with her insurance to see if Zepbound or Reginal Lutes is covered.  Did discuss common side effects of medication and that this is not safe during pregnancy.      Relevant Orders   TSH   Elevated LFTs   Recent ED visit for diverticulitis showed increased LFTs pending CMP today      Relevant Orders   Comprehensive metabolic panel   Other Visit Diagnoses       Screening for lipid disorders       Relevant Orders   Lipid panel     Screening for diabetes mellitus       Relevant Orders   Hemoglobin A1c       Return in about 1 year (around 10/25/2024) for CPE and Labs.   Audria Nine, NP

## 2023-10-26 NOTE — Patient Instructions (Signed)
Nice to see you toyda I will be in touch with the labs once I have them Follow up with me in 1 year for your next physical  Zepbound and wegovy are the two weight loss injectable medications

## 2023-10-26 NOTE — Assessment & Plan Note (Signed)
History of same as recurrent.  Patient has been followed by GI and underwent colonoscopy.  States she gets it 3-4 times a year.

## 2023-10-26 NOTE — Assessment & Plan Note (Signed)
Discussed immunizations and screening exams.  Did review patient's personal, surgical, social, family histories.  Patient is up-to-date on all age-appropriate vaccinations she would like.  Patient refused flu vaccine and tetanus vaccine today.  Patient too young for CRC screening or breast cancer screening.  Patient is up-to-date with cervical cancer screening through her GYN.  Patient was given information at discharge about preventative healthcare maintenance with anticipatory guidance.

## 2023-10-30 ENCOUNTER — Encounter: Payer: Self-pay | Admitting: Nurse Practitioner

## 2023-11-16 MED ORDER — ZEPBOUND 5 MG/0.5ML ~~LOC~~ SOAJ: 5.0000 mg | SUBCUTANEOUS | 0 refills | Status: DC

## 2023-11-16 NOTE — Addendum Note (Signed)
Addended by: Eden Emms on: 11/16/2023 01:39 PM   Modules accepted: Orders

## 2023-12-17 ENCOUNTER — Other Ambulatory Visit: Payer: Self-pay | Admitting: Nurse Practitioner

## 2023-12-17 MED ORDER — ZEPBOUND 7.5 MG/0.5ML ~~LOC~~ SOAJ: 7.5000 mg | SUBCUTANEOUS | 0 refills | Status: DC

## 2024-01-18 ENCOUNTER — Encounter: Payer: Self-pay | Admitting: Nurse Practitioner

## 2024-01-21 ENCOUNTER — Telehealth: Payer: Self-pay | Admitting: Nurse Practitioner

## 2024-01-21 MED ORDER — TIRZEPATIDE-WEIGHT MANAGEMENT 10 MG/0.5ML ~~LOC~~ SOLN: 10.0000 mg | SUBCUTANEOUS | 0 refills | Status: DC

## 2024-01-21 NOTE — Telephone Encounter (Signed)
 Copied from CRM [id number]. Topic: Clinical - Medication Question >> Jan 21, 2024  4:57 PM Tiffany S wrote: Reason for CRM: tirzepatide  10 MG/0.5ML injection vial [4 Pharmacy called stating prescription needs to be sent over as Zepbound  10  They don't carry the compounded version  [phone]

## 2024-01-22 ENCOUNTER — Encounter: Payer: Self-pay | Admitting: Nurse Practitioner

## 2024-01-22 ENCOUNTER — Ambulatory Visit: Admitting: Nurse Practitioner

## 2024-01-22 MED ORDER — ZEPBOUND 10 MG/0.5ML ~~LOC~~ SOAJ: 10.0000 mg | SUBCUTANEOUS | 0 refills | Status: DC

## 2024-01-22 NOTE — Assessment & Plan Note (Signed)
 Patient has had appreciable weight loss of 20 pounds since last office visit.  Will continue Zepbound  titrating.  Patient will start 10 mg weekly.  Patient will continue working on healthy lifestyle modifications inclusive of diet and exercise.

## 2024-01-22 NOTE — Patient Instructions (Signed)
 Nice to see you today I have refilled the zepbound  Follow up with me in 3 months sooner if you need me

## 2024-01-22 NOTE — Progress Notes (Signed)
 Established Patient Office Visit  Subjective   Patient ID: Leah Baxter, female    DOB: 07/19/1996  Age: 28 y.o. MRN: [id number]  Chief Complaint  Patient presents with   Obesity    Patient here today for follow up on zepbound ; patient needs refill today. Patient states she was doing well.     HPI  Obesity: Patient was seen by me on 10/26/2023 we discussed weight loss at that juncture.  Patient had tried phentermine in the past and lost weight.  States that she was having trouble getting the medication in the past she was taking care of her grandmother but for the past several months she has been focusing on herself.  She was struggling with exercise at the last time we met in noticed that if she was not losing weight she would relapse and binge eat.  Patient was started on Zepbound  and is currently maintained on 10 mg  States that two days after the injectoin she will get a small rash that will itchy but go away   States she is getting a little constipation and moving once a day Her diet she is eating 2 times a day and will skip breakfast most days. States that the portoins are smaller. She is eating at home more than she is eating out. States that she is not doing the best in exercise. She has been walking more since the weather change. States that she is doing twice a week    Review of Systems  Constitutional:  Negative for chills and fever.  Respiratory:  Negative for shortness of breath.   Cardiovascular:  Negative for chest pain.  Gastrointestinal:  Negative for abdominal pain, constipation, diarrhea, nausea and vomiting.  Skin:  Positive for rash.  Neurological:  Negative for headaches.      Objective:     BP 112/80 (BP Location: Left Arm, Patient Position: Sitting, Cuff Size: Normal)   Pulse 89   Temp 98.3 F (36.8 C) (Oral)   Ht 5' 1.75" (1.568 m)   Wt 205 lb (93 kg)   SpO2 99%   BMI 37.80 kg/m  BP Readings from Last 3 Encounters:  01/22/24 112/80   10/26/23 134/86  10/10/23 (!) 138/97   Wt Readings from Last 3 Encounters:  01/22/24 205 lb (93 kg)  10/26/23 220 lb 8 oz (100 kg)  10/10/23 225 lb (102.1 kg)   SpO2 Readings from Last 3 Encounters:  01/22/24 99%  10/26/23 99%  10/10/23 99%      Physical Exam Vitals and nursing note reviewed.  Constitutional:      Appearance: Normal appearance.  Cardiovascular:     Rate and Rhythm: Normal rate and regular rhythm.     Heart sounds: Normal heart sounds.  Pulmonary:     Effort: Pulmonary effort is normal.     Breath sounds: Normal breath sounds.  Abdominal:     General: Bowel sounds are normal. There is no distension.     Palpations: There is no mass.     Tenderness: There is no abdominal tenderness.     Hernia: No hernia is present.  Neurological:     Mental Status: She is alert.      No results found for any visits on 01/22/24.    The ASCVD Risk score (Arnett DK, et al., 2019) failed to calculate for the following reasons:   The 2019 ASCVD risk score is only valid for ages 24 to 70    Assessment & Plan:  Problem List Items Addressed This Visit       Other   Morbid obesity (HCC) - Primary   Patient has had appreciable weight loss of 20 pounds since last office visit.  Will continue Zepbound  titrating.  Patient will start 10 mg weekly.  Patient will continue working on healthy lifestyle modifications inclusive of diet and exercise.      Relevant Medications   tirzepatide  (ZEPBOUND ) 10 MG/0.5ML Pen    Return in about 3 months (around 04/22/2024) for zepbound /weight recheck .    Margarie Shay, NP

## 2024-03-05 MED ORDER — ZEPBOUND 12.5 MG/0.5ML ~~LOC~~ SOAJ
12.5000 mg | SUBCUTANEOUS | 0 refills | Status: DC
Start: 2024-03-05 — End: 2024-04-09

## 2024-03-05 NOTE — Addendum Note (Signed)
 Addended by: Dorothe Gaster on: 03/05/2024 01:04 PM   Modules accepted: Orders

## 2024-03-13 ENCOUNTER — Ambulatory Visit: Payer: Self-pay

## 2024-03-13 ENCOUNTER — Ambulatory Visit: Admitting: Family Medicine

## 2024-03-13 ENCOUNTER — Encounter: Payer: Self-pay | Admitting: Family Medicine

## 2024-03-13 VITALS — BP 137/90 | HR 108 | Temp 97.5°F | Ht 61.75 in | Wt 202.0 lb

## 2024-03-13 DIAGNOSIS — I10 Essential (primary) hypertension: Secondary | ICD-10-CM | POA: Diagnosis not present

## 2024-03-13 DIAGNOSIS — R7989 Other specified abnormal findings of blood chemistry: Secondary | ICD-10-CM | POA: Diagnosis not present

## 2024-03-13 DIAGNOSIS — G5621 Lesion of ulnar nerve, right upper limb: Secondary | ICD-10-CM

## 2024-03-13 DIAGNOSIS — F1010 Alcohol abuse, uncomplicated: Secondary | ICD-10-CM

## 2024-03-13 DIAGNOSIS — D367 Benign neoplasm of other specified sites: Secondary | ICD-10-CM

## 2024-03-13 NOTE — Progress Notes (Signed)
 Jaydyn Menon T. Jayshawn Colston, MD, CAQ Sports Medicine [facility] [address] [place], [address]  Phone: [phone]  FAX: [phone]  Leah Baxter - 28 y.o. female  MRN [id number]  Date of Birth: Jul 20, 1996  Date: 03/13/2024  PCP: Dorothe Gaster, NP  Referral: Dorothe Gaster, NP  Chief Complaint  Patient presents with   Tingling    Right Arm   Mass    Upper Right Arm   Hypertension   Subjective:   Leah Baxter is a 28 y.o. very pleasant female patient with Body mass index is 37.25 kg/m. who presents with the following:  Patient presents with a couple of ongoing issues.  She has had some intermittent tingling on the right arm, Molnupiravir in the ulnar distribution from the upper arm all the way down to the hand and fingers.  This has been somewhat variable, but relatively persistent over the last couple of weeks.  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  She has not had count of trauma any injury.  She does not have any neck pain pain with terminal motion, inducible radicular pains, numbness, or tingling with motion at the neck.  She also describes a small mobile mass or lesion in the upper arm.  Does not cause any pain, and right now it is not bothering her at all.  R arm was falling asleep and tngling Switched to the other sde and it was still tingling Felt a small cyst  Blood pressure was also elevated today.  She has been titrating up Zepbound  for weight loss, and she is already lost a considerable amount of weight.  BP Readings from Last 3 Encounters:  03/13/24 (!) 137/90  01/22/24 112/80  10/26/23 134/86    25 pounds down  In reviewing her labs and talking about hypertension, it came to light that the patient drinks usually at least 5 or 6 drinks a night, and she also drinks well above 12 at times during the weekend.    Review of Systems is noted in the HPI, as appropriate  Objective:   BP (!) 137/90    Pulse (!) 108   Temp (!) 97.5 F (36.4 C) (Temporal)   Ht 5' 1.75 (1.568 m)   Wt 202 lb (91.6 kg)   LMP 02/22/2024   SpO2 98%   BMI 37.25 kg/m   GEN: No acute distress; alert,appropriate. PULM: Breathing comfortably in no respiratory distress PSYCH: Normally interactive.   Full range of motion at the shoulder Full range of motion at the neck with no inducible radiculopathy Strength is 5/5 throughout the entirety of the shoulder, elbow, hand and wrist  I do not appreciate any decrease sensation to soft and pinprick  I do not palpate a mass in the arm or a cyst.  Historically sounds like there was a small cyst. Tinel's, Phalen's are all normal.  Laboratory and Imaging Data:  Assessment and Plan:     ICD-10-CM   1. Cubital tunnel syndrome on right  G56.21     2. Dermoid cyst of arm, right  D36.7     3. Essential hypertension  I10     4. Elevated LFTs  R79.89     5. Alcohol abuse  F10.10      I think that arm tingling is from cubital tunnel.  Recommend using a pad or pillow while having the elbow on her desk.  Recommended either ulnar neuropathy brace  or wrapping the arm with a towel at sleep for the next couple of weeks.  Elevated blood pressure, hypertension.  She is losing a significant amount of weight on Zepbound , I think is okay to monitor this over time.  In our discussion, it came to light that the patient drinks most days greater than 6 drinks a day and often drinks greater than 12 when she is going out.  She has persistently elevated liver function going back at least to 5 years.  I find this very alarming.  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   Disposition: Follow-up for routine care with primary care doctor  Dr[employer] One speech-to-text software was used for transcription in this dictation.  Possible  transcriptional errors can occur using Animal nutritionist.   Signed,  Ranny Bye. Neha Waight, MD   Outpatient Encounter Medications as of 03/13/2024  Medication Sig   tirzepatide  (ZEPBOUND ) 12.5 MG/0.5ML Pen Inject 12.5 mg into the skin once a week.   No facility-administered encounter medications on file as of 03/13/2024.

## 2024-03-13 NOTE — Telephone Encounter (Signed)
 FYI Only or Action Required?: FYI only for provider  Patient was last seen in primary care on 01/22/2024 by Dorothe Gaster, NP. Called Nurse Triage reporting Neurologic Problem. Symptoms began several weeks ago. Interventions attempted: Nothing. Symptoms are: stable.  Triage Disposition: See HCP Within 4 Hours (Or PCP Triage)  Patient/caregiver understands and will follow disposition?: Yes    Copied from CRM [id number]. Topic: Clinical - Red Word Triage >> Mar 13, 2024  1:46 PM Leah Baxter wrote: Red Word that prompted transfer to Nurse Triage: patient has tingling in right arm, all the way down arm, patient has a little mass close to arm pit, when squeezed it makes tingling worse. Now tingling lasts all day. Reason for Disposition  [1] Numbness (i.e., loss of sensation) of the face, arm / hand, or leg / foot on one side of the body AND [2] gradual onset (e.g., days to weeks) AND [3] present now  Answer Assessment - Initial Assessment Questions 1. SYMPTOM: What is the main symptom you are concerned about? (e.g., weakness, numbness)     Right arm, low arm pit has a mass (size of marble) and she has numbness and tingling 2. ONSET: When did this start? (minutes, hours, days; while sleeping)     Couple weeks ago 3. LAST NORMAL: When was the last time you (the patient) were normal (no symptoms)?     Couple weeks ago 4. PATTERN Does this come and go, or has it been constant since it started?  Is it present now?     constant 5. CARDIAC SYMPTOMS: Have you had any of the following symptoms: chest pain, difficulty breathing, palpitations?     no 6. NEUROLOGIC SYMPTOMS: Have you had any of the following symptoms: headache, dizziness, vision loss, double vision, changes in speech, unsteady on your feet?     lightheaded 7. OTHER SYMPTOMS: Do you have any other symptoms?     na 8. PREGNANCY: Is there any chance you are pregnant? When was your last menstrual period?     na  Protocols  used: Neurologic Deficit-A-AH

## 2024-03-14 NOTE — Telephone Encounter (Signed)
 I spoke with pt; pt was seen on 03/13/24 by Dr Geralyn Knee; I spoke with pt and she is doing well. Sending FYI to Margarie Shay NP.

## 2024-03-14 NOTE — Telephone Encounter (Signed)
 I see that dispo is be seen within 4 hours but do not see that she has an appt??

## 2024-04-09 ENCOUNTER — Ambulatory Visit: Admitting: Nurse Practitioner

## 2024-04-09 VITALS — BP 102/86 | HR 72 | Temp 98.3°F | Ht 61.75 in | Wt 198.6 lb

## 2024-04-09 DIAGNOSIS — F411 Generalized anxiety disorder: Secondary | ICD-10-CM | POA: Diagnosis not present

## 2024-04-09 DIAGNOSIS — Z3200 Encounter for pregnancy test, result unknown: Secondary | ICD-10-CM | POA: Diagnosis not present

## 2024-04-09 DIAGNOSIS — F321 Major depressive disorder, single episode, moderate: Secondary | ICD-10-CM | POA: Insufficient documentation

## 2024-04-09 LAB — POCT URINE PREGNANCY: Preg Test, Ur: NEGATIVE

## 2024-04-09 MED ORDER — HYDROXYZINE HCL 10 MG PO TABS: 10.0000 mg | ORAL_TABLET | Freq: Three times a day (TID) | ORAL | 0 refills | Status: DC | PRN

## 2024-04-09 MED ORDER — ZEPBOUND 15 MG/0.5ML ~~LOC~~ SOAJ: 15.0000 mg | SUBCUTANEOUS | 0 refills | Status: DC

## 2024-04-09 MED ORDER — SERTRALINE HCL 50 MG PO TABS: ORAL_TABLET | ORAL | 0 refills | Status: DC

## 2024-04-09 NOTE — Assessment & Plan Note (Signed)
 Start zoloft  25mg  at bedtime for 2 weeks then increase to 50mg  at bedtime there after. Will do hydroxyzine  10mg  TID PRN, sedation precautions reviewed. If ineffective she will call back and we will increase hydroxyzine  to 25mg  TID PRN

## 2024-04-09 NOTE — Patient Instructions (Signed)
 Nice to see you today  I have sent in the medications as we discussed Follow up with me in 4 weeks, sooner if you need me

## 2024-04-09 NOTE — Assessment & Plan Note (Signed)
 History of the same. Did try welbutrin and lexapro  in the past. Will start on zoloft  25mg  at bedtime for 2 weeks then increase to 50mg  at bedtime there after. No active HI/SI/AVH.

## 2024-04-09 NOTE — Progress Notes (Signed)
 Acute Office Visit  Subjective:     Patient ID: Leah Baxter, female    DOB: 05/07/1996, 28 y.o.   MRN: [id number]  Chief Complaint  Patient presents with   Depression   Anxiety     Patient is in today for anxiety/depression with a history of anxiety and depression, diverticulitis, obesity.  Patient was last seen by me on 01/22/2024 for weight loss  Anxiety/depression: states that she has had depression in the past and tried medication and did not like it. States that for the past few months she has been anxious with some depression and being snappy. State that she is not sleeping. States that she is getting 2 hours of lseep. States that she is on edge.  States that she tried lexapro  and it made her numb. She did try welbutrin and did well.  She does have a stressful job.  States that her last period was may 23rd and she has taken 3 pregnancy test most recent one this week that was negative      04/09/2024    2:53 PM 01/22/2024    9:38 AM 10/26/2023   10:12 AM  PHQ9 SCORE ONLY  PHQ-9 Total Score 14 0 8       04/09/2024    2:55 PM 01/22/2024    9:38 AM 10/26/2023   10:12 AM  GAD 7 : Generalized Anxiety Score  Nervous, Anxious, on Edge 3 0 1  Control/stop worrying 3 0 1  Worry too much - different things 3 0 1  Trouble relaxing 2 0 1  Restless 0 0 1  Easily annoyed or irritable 3 0 2  Afraid - awful might happen 1 0 0  Total GAD 7 Score 15 0 7  Anxiety Difficulty Very difficult Not difficult at all Somewhat difficult     Review of Systems  Constitutional:  Negative for chills and fever.  Respiratory:  Negative for shortness of breath.   Cardiovascular:  Negative for chest pain.  Neurological:  Negative for headaches.  Psychiatric/Behavioral:  Positive for depression. Negative for hallucinations and suicidal ideas. The patient has insomnia.         Objective:    BP 102/86   Pulse 72   Temp 98.3 F (36.8 C) (Oral)   Ht 5' 1.75 (1.568 m)   Wt 198 lb 9.6 oz (90.1  kg)   LMP 02/22/2024   SpO2 98%   BMI 36.62 kg/m  BP Readings from Last 3 Encounters:  04/09/24 102/86  03/13/24 (!) 137/90  01/22/24 112/80   Wt Readings from Last 3 Encounters:  04/09/24 198 lb 9.6 oz (90.1 kg)  03/13/24 202 lb (91.6 kg)  01/22/24 205 lb (93 kg)   SpO2 Readings from Last 3 Encounters:  04/09/24 98%  03/13/24 98%  01/22/24 99%      Physical Exam Vitals and nursing note reviewed.  Constitutional:      Appearance: Normal appearance.  Cardiovascular:     Rate and Rhythm: Normal rate and regular rhythm.     Heart sounds: Normal heart sounds.  Pulmonary:     Effort: Pulmonary effort is normal.     Breath sounds: Normal breath sounds.  Neurological:     Mental Status: She is alert.  Psychiatric:        Attention and Perception: Attention normal.        Mood and Affect: Mood is anxious. Affect is tearful.        Speech: Speech normal.  Behavior: Behavior normal.        Thought Content: Thought content normal.        Cognition and Memory: Cognition normal.        Judgment: Judgment normal.     No results found for any visits on 04/09/24.      Assessment & Plan:   Problem List Items Addressed This Visit       Other   Morbid obesity (HCC)   Relevant Medications   tirzepatide  (ZEPBOUND ) 15 MG/0.5ML Pen   Possible pregnancy - Primary   Relevant Orders   POCT urine pregnancy   Current moderate episode of major depressive disorder (HCC)   History of the same. Did try welbutrin and lexapro  in the past. Will start on zoloft  25mg  at bedtime for 2 weeks then increase to 50mg  at bedtime there after. No active HI/SI/AVH.      Relevant Medications   sertraline  (ZOLOFT ) 50 MG tablet   hydrOXYzine  (ATARAX ) 10 MG tablet   GAD (generalized anxiety disorder)   Start zoloft  25mg  at bedtime for 2 weeks then increase to 50mg  at bedtime there after. Will do hydroxyzine  10mg  TID PRN, sedation precautions reviewed. If ineffective she will call back and we  will increase hydroxyzine  to 25mg  TID PRN       Relevant Medications   sertraline  (ZOLOFT ) 50 MG tablet   hydrOXYzine  (ATARAX ) 10 MG tablet    Meds ordered this encounter  Medications   tirzepatide  (ZEPBOUND ) 15 MG/0.5ML Pen    Sig: Inject 15 mg into the skin once a week.    Dispense:  2 mL    Refill:  0    Supervising Provider:   RANDEEN HARDY A [[id number]]   sertraline  (ZOLOFT ) 50 MG tablet    Sig: Take 0.5 tablets (25 mg total) by mouth daily for 14 days, THEN 1 tablet (50 mg total) daily for 16 days.    Dispense:  23 tablet    Refill:  0    Supervising Provider:   RANDEEN HARDY A [[id number]]   hydrOXYzine  (ATARAX ) 10 MG tablet    Sig: Take 1 tablet (10 mg total) by mouth 3 (three) times daily as needed.    Dispense:  30 tablet    Refill:  0    Supervising Provider:   RANDEEN HARDY A [[id number]]    Return in about 4 weeks (around 05/07/2024) for MDD/GAD/Zepbound  .  Adina Crandall, NP

## 2024-04-22 ENCOUNTER — Ambulatory Visit: Admitting: Nurse Practitioner

## 2024-05-02 ENCOUNTER — Ambulatory Visit: Admitting: Nurse Practitioner

## 2024-05-02 VITALS — BP 112/62 | HR 95 | Temp 98.2°F | Ht 61.75 in | Wt 191.6 lb

## 2024-05-02 DIAGNOSIS — N3090 Cystitis, unspecified without hematuria: Secondary | ICD-10-CM | POA: Insufficient documentation

## 2024-05-02 DIAGNOSIS — R300 Dysuria: Secondary | ICD-10-CM | POA: Insufficient documentation

## 2024-05-02 LAB — POCT URINALYSIS DIPSTICK
Bilirubin, UA: POSITIVE
Blood, UA: POSITIVE
Glucose, UA: POSITIVE — AB
Ketones, UA: POSITIVE
Nitrite, UA: POSITIVE
Protein, UA: POSITIVE — AB
Spec Grav, UA: 1.025 (ref 1.010–1.025)
Urobilinogen, UA: 2 U/dL — AB
pH, UA: 5 (ref 5.0–8.0)

## 2024-05-02 MED ORDER — NITROFURANTOIN MONOHYD MACRO 100 MG PO CAPS: 100.0000 mg | ORAL_CAPSULE | Freq: Two times a day (BID) | ORAL | 0 refills | Status: DC

## 2024-05-02 NOTE — Progress Notes (Signed)
 Acute Office Visit  Subjective:     Patient ID: Leah Baxter, female    DOB: [birth year], 28 y.o.   MRN: [id number]  Chief Complaint  Patient presents with   Urinary Tract Infection    Pt complains of pain while urinating and urinary frequency that starts Tuesday morning. Pt is currently taking AZO.     HPI Patient is in today for dysuria with a history of diverticulitis, Acute diverticulitis, and depression, obesity  States that she noticed it Tuesday morning. Dysuria, frequency.  She has taken the AZO and it has helped. States that it does not feel as effective as  States that she went swimming and when she wears her suit for anwhile she can get them   Review of Systems  Constitutional:  Negative for chills and fever.  Gastrointestinal:  Negative for abdominal pain, nausea and vomiting.  Genitourinary:  Positive for dysuria and frequency. Negative for urgency.  Musculoskeletal:  Negative for back pain.        Objective:    BP 112/62   Pulse 95   Temp 98.2 F (36.8 C) (Oral)   Ht 5' 1.75 (1.568 m)   Wt 191 lb 9.6 oz (86.9 kg)   SpO2 98%   BMI 35.33 kg/m    Physical Exam Vitals and nursing note reviewed.  Constitutional:      Appearance: Normal appearance.  Cardiovascular:     Rate and Rhythm: Normal rate and regular rhythm.     Heart sounds: Normal heart sounds.  Pulmonary:     Effort: Pulmonary effort is normal.     Breath sounds: Normal breath sounds.  Abdominal:     General: Bowel sounds are normal. There is no distension.     Palpations: There is no mass.     Tenderness: There is no abdominal tenderness. There is no right CVA tenderness or left CVA tenderness.     Hernia: No hernia is present.   Neurological:     Mental Status: She is alert.     Results for orders placed or performed in visit on 05/02/24  POCT urinalysis dipstick  Result Value Ref Range   Color, UA red    Clarity, UA cloudy    Glucose, UA Positive (A) Negative    Bilirubin, UA pos    Ketones, UA pos    Spec Grav, UA 1.025 1.010 - 1.025   Blood, UA pos    pH, UA 5.0 5.0 - 8.0   Protein, UA Positive (A) Negative   Urobilinogen, UA 2.0 (A) 0.2 or 1.0 E.U./dL   Nitrite, UA pos    Leukocytes, UA Large (3+) (A) Negative   Appearance     Odor          Assessment & Plan:   Problem List Items Addressed This Visit       Genitourinary   Cystitis   Given symptoms will elect to treat. Patient on azo. Drink plenty of fluid. Macrobid 100mg  BID for 5 days      Relevant Medications   nitrofurantoin, macrocrystal-monohydrate, (MACROBID) 100 MG capsule   Other Relevant Orders   Urine Culture     Other   Dysuria - Primary   UA      Relevant Orders   POCT urinalysis dipstick (Completed)    Meds ordered this encounter  Medications   nitrofurantoin, macrocrystal-monohydrate, (MACROBID) 100 MG capsule    Sig: Take 1 capsule (100 mg total) by mouth 2 (two) times daily.  Dispense:  10 capsule    Refill:  0    Supervising Provider:   RANDEEN HARDY A [[id number]]    Return if symptoms worsen or fail to improve, for As scheduled .  Adina Crandall, NP

## 2024-05-02 NOTE — Assessment & Plan Note (Signed)
 UA

## 2024-05-02 NOTE — Assessment & Plan Note (Signed)
 Given symptoms will elect to treat. Patient on azo. Drink plenty of fluid. Macrobid 100mg  BID for 5 days

## 2024-05-02 NOTE — Patient Instructions (Signed)
 Nice to see you today Drink plenty of fluid You can use the AZO for 2 more days but then stop I have sent an antibiotic to the pharmacy Follow up with me as scheduled, sooner if you need me

## 2024-05-04 LAB — URINE CULTURE
MICRO NUMBER:: 16776155
SPECIMEN QUALITY:: ADEQUATE

## 2024-05-06 ENCOUNTER — Ambulatory Visit: Payer: Self-pay | Admitting: Nurse Practitioner

## 2024-05-07 ENCOUNTER — Ambulatory Visit: Admitting: Nurse Practitioner

## 2024-05-07 VITALS — BP 112/76 | HR 105 | Temp 98.3°F | Ht 61.75 in | Wt 188.8 lb

## 2024-05-07 DIAGNOSIS — F321 Major depressive disorder, single episode, moderate: Secondary | ICD-10-CM | POA: Diagnosis not present

## 2024-05-07 DIAGNOSIS — E669 Obesity, unspecified: Secondary | ICD-10-CM | POA: Insufficient documentation

## 2024-05-07 DIAGNOSIS — F5105 Insomnia due to other mental disorder: Secondary | ICD-10-CM | POA: Diagnosis not present

## 2024-05-07 DIAGNOSIS — F411 Generalized anxiety disorder: Secondary | ICD-10-CM | POA: Diagnosis not present

## 2024-05-07 DIAGNOSIS — F99 Mental disorder, not otherwise specified: Secondary | ICD-10-CM

## 2024-05-07 MED ORDER — TRAZODONE HCL 50 MG PO TABS: 25.0000 mg | ORAL_TABLET | Freq: Every evening | ORAL | 0 refills | Status: DC | PRN

## 2024-05-07 MED ORDER — LORAZEPAM 0.5 MG PO TABS
0.5000 mg | ORAL_TABLET | Freq: Every day | ORAL | 0 refills | Status: DC | PRN
Start: 2024-05-07 — End: 2024-08-07

## 2024-05-07 NOTE — Assessment & Plan Note (Signed)
 Patient still doing well on Zepbound  currently maintained on 50 mg weekly.  Patient did have some nausea postinjection at self resolves.  She still working lots of modifications encouraged exercise 30 minutes a day 5 times a week.

## 2024-05-07 NOTE — Progress Notes (Signed)
 Established Patient Office Visit  Subjective   Patient ID: Leah Baxter, female    DOB: 1996/08/05  Age: 28 y.o. MRN: [id number]  Chief Complaint  Patient presents with   Follow-up    Pt complains of not doing well. States of having trouble with anxiety medication. Complains of having panic attacks, agitated, feels like I'm living outside my body spaced out Ongoing for 3 weeks.     HPI  Obesity: Patient was last seen by me on on 05/02/2024 for acute visit.  Previously she was seen on 01/22/2024.  At that juncture patient was started on Zepbound  10 mg weekly and had an approximately 20 pound weight loss in that point.  She was seen in the interim on 04/09/2024 for major depressive disorder and GAD.  Patient is currently maintained on Zepbound  15 mg weekly. States that she is having some nausea within the first 24 hours of the injection and then will abate. Statse that she does eat really small almost snakcing and then will have dinner.  She is drinking water  and occs a sprite.  States that exericse is not as good as it needs to be. States that she will walk once a week   MDD/GAD: Patient currently maintained on hydroxyzine  10 mg 3 times daily as needed and sertraline  50 mg daily. States that she feels like it has made it worse. States that she has been having a lot of panic attacks. States that she is not sleeping as all. States that she is tried and aggitate. States that she feels like she is asleep in a dream States that she sfeels like she is going cray.  States that her older sister had depression and thought to have biploar and did commit suicide.  Statse that she has  twin sister that has been on depression medicatioms   States that she has tried the hydrozyine and it does not help with panic but it can help with sleep some. He has tried to take 2 pills and that was not effective        05/07/2024    8:05 AM 04/09/2024    2:53 PM 01/22/2024    9:38 AM  PHQ9 SCORE ONLY  PHQ-9 Total  Score 11 14 0       05/07/2024    8:07 AM 04/09/2024    2:55 PM 01/22/2024    9:38 AM 10/26/2023   10:12 AM  GAD 7 : Generalized Anxiety Score  Nervous, Anxious, on Edge 3 3 0 1  Control/stop worrying 3 3 0 1  Worry too much - different things 3 3 0 1  Trouble relaxing 3 2 0 1  Restless 3 0 0 1  Easily annoyed or irritable 3 3 0 2  Afraid - awful might happen 2 1 0 0  Total GAD 7 Score 20 15 0 7  Anxiety Difficulty Extremely difficult Very difficult Not difficult at all Somewhat difficult       Review of Systems  Constitutional:  Negative for chills and fever.  Respiratory:  Negative for shortness of breath.   Cardiovascular:  Negative for chest pain.  Gastrointestinal:  Positive for nausea. Negative for abdominal pain, constipation and diarrhea.       Bm daily to every other day   Neurological:  Positive for headaches.  Psychiatric/Behavioral:  Negative for hallucinations and suicidal ideas. The patient has insomnia.       Objective:     BP 112/76   Pulse (!) 105  Temp 98.3 F (36.8 C) (Oral)   Ht 5' 1.75 (1.568 m)   Wt 188 lb 12.8 oz (85.6 kg)   SpO2 96%   BMI 34.81 kg/m  BP Readings from Last 3 Encounters:  05/07/24 112/76  05/02/24 112/62  04/09/24 102/86   Wt Readings from Last 3 Encounters:  05/07/24 188 lb 12.8 oz (85.6 kg)  05/02/24 191 lb 9.6 oz (86.9 kg)  04/09/24 198 lb 9.6 oz (90.1 kg)   SpO2 Readings from Last 3 Encounters:  05/07/24 96%  05/02/24 98%  04/09/24 98%      Physical Exam Vitals and nursing note reviewed.  Constitutional:      Appearance: Normal appearance.  Cardiovascular:     Rate and Rhythm: Normal rate and regular rhythm.     Heart sounds: Normal heart sounds.  Pulmonary:     Effort: Pulmonary effort is normal.     Breath sounds: Normal breath sounds.  Neurological:     Mental Status: She is alert.  Psychiatric:        Attention and Perception: Attention normal.        Mood and Affect: Mood is anxious and depressed.  Affect is tearful.        Speech: Speech normal.        Behavior: Behavior normal.        Thought Content: Thought content normal.        Cognition and Memory: Cognition normal.      No results found for any visits on 05/07/24.    The ASCVD Risk score (Arnett DK, et al., 2019) failed to calculate for the following reasons:   The 2019 ASCVD risk score is only valid for ages 50 to 39    Assessment & Plan:   Problem List Items Addressed This Visit       Other   Current moderate episode of major depressive disorder (HCC)   Patient was already on sertraline  titrated up to 50 mg daily.  Seems to make symptoms worse.  We will discontinue sertraline  doing 25 mg for 5 days then stop altogether.  Patient was given behavioral health urgent care information if needed.  No active HI/SI/AVH currently.  Amatory referral to psychiatry urgently.      Relevant Medications   LORazepam  (ATIVAN ) 0.5 MG tablet   traZODone  (DESYREL ) 50 MG tablet   Other Relevant Orders   Ambulatory referral to Psychiatry   GAD (generalized anxiety disorder)   Seems to increase with SSRI.  Will discontinue SSRI start lorazepam  0.5 mg daily as needed.  Sedation precautions reviewed urgent referral to psychiatry query mood disorder      Relevant Medications   LORazepam  (ATIVAN ) 0.5 MG tablet   traZODone  (DESYREL ) 50 MG tablet   Other Relevant Orders   Ambulatory referral to Psychiatry   Obesity (BMI 30-39.9) - Primary   Patient still doing well on Zepbound  currently maintained on 50 mg weekly.  Patient did have some nausea postinjection at self resolves.  She still working lots of modifications encouraged exercise 30 minutes a day 5 times a week.      Insomnia due to other mental disorder   Patient is having some insomnia she has tried hydroxyzine  without good relief.  Will do trazodone  50 mg nightly as needed.      Relevant Medications   traZODone  (DESYREL ) 50 MG tablet    Return in about 3 months (around  08/07/2024) for weight/mood .    Adina Crandall, NP

## 2024-05-07 NOTE — Patient Instructions (Addendum)
 Nice to see you today Start taking 0.5 tab of the sertraline  for 5 days then stop (25mg ) You can try the trazodone  0.5 tabs at bedtime (25mg ) if you are not asleep in a hour take th eother half to equal a total of 50mg .  [facility]  Address: [address], [place], [place] [address] Phone: [phone] They are open 24/7

## 2024-05-07 NOTE — Assessment & Plan Note (Signed)
 Patient is having some insomnia she has tried hydroxyzine  without good relief.  Will do trazodone  50 mg nightly as needed.

## 2024-05-07 NOTE — Assessment & Plan Note (Signed)
 Seems to increase with SSRI.  Will discontinue SSRI start lorazepam  0.5 mg daily as needed.  Sedation precautions reviewed urgent referral to psychiatry query mood disorder

## 2024-05-07 NOTE — Assessment & Plan Note (Signed)
 Patient was already on sertraline  titrated up to 50 mg daily.  Seems to make symptoms worse.  We will discontinue sertraline  doing 25 mg for 5 days then stop altogether.  Patient was given behavioral health urgent care information if needed.  No active HI/SI/AVH currently.  Amatory referral to psychiatry urgently.

## 2024-05-14 ENCOUNTER — Telehealth: Payer: Self-pay | Admitting: Nurse Practitioner

## 2024-05-14 NOTE — Telephone Encounter (Signed)
 Called pt.  Pt has not heard back from psychiatry but states that her mood is better.  I feel the complete opposite

## 2024-05-14 NOTE — Telephone Encounter (Signed)
 Can we check on patient's mood and see if she has gotten in with psychiatry yet

## 2024-05-14 NOTE — Telephone Encounter (Signed)
-----   Message from [facility] sent at 05/07/2024  8:30 AM EDT ----- Regarding: Mood See how mood is going and if she has an appt with psychiatry

## 2024-05-22 ENCOUNTER — Other Ambulatory Visit: Payer: Self-pay | Admitting: Nurse Practitioner

## 2024-05-22 ENCOUNTER — Encounter: Payer: Self-pay | Admitting: Nurse Practitioner

## 2024-06-11 ENCOUNTER — Other Ambulatory Visit: Payer: Self-pay

## 2024-06-11 ENCOUNTER — Emergency Department
Admission: EM | Admit: 2024-06-11 | Discharge: 2024-06-11 | Disposition: A | Discharge status: Home / Self Care | Attending: Emergency Medicine | Admitting: Emergency Medicine

## 2024-06-11 DIAGNOSIS — R1084 Generalized abdominal pain: Secondary | ICD-10-CM | POA: Insufficient documentation

## 2024-06-11 DIAGNOSIS — R110 Nausea: Secondary | ICD-10-CM | POA: Insufficient documentation

## 2024-06-11 DIAGNOSIS — R197 Diarrhea, unspecified: Secondary | ICD-10-CM | POA: Insufficient documentation

## 2024-06-11 DIAGNOSIS — I10 Essential (primary) hypertension: Secondary | ICD-10-CM | POA: Diagnosis not present

## 2024-06-11 LAB — COMPREHENSIVE METABOLIC PANEL WITH GFR
ALT: 75 U/L — ABNORMAL HIGH (ref 0–44)
AST: 49 U/L — ABNORMAL HIGH (ref 15–41)
Albumin: 4.2 g/dL (ref 3.5–5.0)
Alkaline Phosphatase: 50 U/L (ref 38–126)
Anion gap: 10 (ref 5–15)
BUN: 9 mg/dL (ref 6–20)
CO2: 24 mmol/L (ref 22–32)
Calcium: 9.4 mg/dL (ref 8.9–10.3)
Chloride: 106 mmol/L (ref 98–111)
Creatinine, Ser: 0.68 mg/dL (ref 0.44–1.00)
GFR, Estimated: >60 mL/min (ref >60)
Glucose, Bld: 89 mg/dL (ref 70–99)
Potassium: 4 mmol/L (ref 3.5–5.1)
Sodium: 140 mmol/L (ref 135–145)
Total Bilirubin: 1.6 mg/dL — ABNORMAL HIGH (ref 0.0–1.2)
Total Protein: 7.4 g/dL (ref 6.5–8.1)

## 2024-06-11 LAB — CBC
HCT: 46.6 % — ABNORMAL HIGH (ref 36.0–46.0)
Hemoglobin: 15.6 g/dL — ABNORMAL HIGH (ref 12.0–15.0)
MCH: 32.6 pg (ref 26.0–34.0)
MCHC: 33.5 g/dL (ref 30.0–36.0)
MCV: 97.5 fL (ref 80.0–100.0)
Platelets: 292 K/uL (ref 150–400)
RBC: 4.78 MIL/uL (ref 3.87–5.11)
RDW: 13.1 % (ref 11.5–15.5)
WBC: 11.2 K/uL — ABNORMAL HIGH (ref 4.0–10.5)
nRBC: 0 % (ref 0.0–0.2)

## 2024-06-11 LAB — URINALYSIS, ROUTINE W REFLEX MICROSCOPIC
Bilirubin Urine: NEGATIVE
Glucose, UA: NEGATIVE mg/dL
Hgb urine dipstick: NEGATIVE
Ketones, ur: NEGATIVE mg/dL
Leukocytes,Ua: NEGATIVE
Nitrite: NEGATIVE
Protein, ur: NEGATIVE mg/dL
Specific Gravity, Urine: 1.023 (ref 1.005–1.030)
pH: 5 (ref 5.0–8.0)

## 2024-06-11 LAB — POC URINE PREG, ED: Preg Test, Ur: NEGATIVE

## 2024-06-11 LAB — LIPASE, BLOOD: Lipase: 39 U/L (ref 11–51)

## 2024-06-11 MED ORDER — AZITHROMYCIN 250 MG PO TABS: ORAL_TABLET | ORAL | 0 refills | Status: DC

## 2024-06-11 MED ORDER — ONDANSETRON 4 MG PO TBDP: 4.0000 mg | ORAL_TABLET | Freq: Three times a day (TID) | ORAL | 0 refills | Status: DC | PRN

## 2024-06-11 MED ORDER — AZITHROMYCIN 250 MG PO TABS
ORAL_TABLET | ORAL | 0 refills | Status: DC
Start: 2024-06-11 — End: 2024-06-11

## 2024-06-11 MED ORDER — DICYCLOMINE HCL 10 MG PO CAPS: 10.0000 mg | ORAL_CAPSULE | Freq: Three times a day (TID) | ORAL | 0 refills | Status: DC

## 2024-06-11 NOTE — ED Provider Notes (Signed)
 [facility] Emergency Department Provider Note     Event Date/Time   First MD Initiated Contact with Patient 06/11/24 1636     (approximate)   History   Abdominal Pain   HPI  Leah Baxter is a 28 y.o. female with a past medical history of diverticulitis, HTN and colon polyps presents to the ED for generalized abdominal dullness x 2 weeks.  Patient reports dullness radiates across abdomen and more prominent in her LUQ today. She denies any pain. Admits to excessive alcohol intake. She reports starting zepbound  at the beginning of the year. Hx of diverticulitis, but today's symptoms are not the same. Endorses nausea and diarrhea. No fevers. No hx of abdominal surgeries. No chest pain or shortness of breath.     Physical Exam   Triage Vital Signs: ED Triage Vitals  Encounter Vitals Group     BP 06/11/24 1459 (!) 142/104     Girls Systolic BP Percentile --      Girls Diastolic BP Percentile --      Boys Systolic BP Percentile --      Boys Diastolic BP Percentile --      Pulse Rate 06/11/24 1459 97     Resp 06/11/24 1459 18     Temp 06/11/24 1459 98.7 F (37.1 C)     Temp Source 06/11/24 1459 Oral     SpO2 06/11/24 1459 100 %     Weight 06/11/24 1503 180 lb (81.6 kg)     Height 06/11/24 1503 5' 1 (1.549 m)     Head Circumference --      Peak Flow --      Pain Score 06/11/24 1500 6     Pain Loc --      Pain Education --      Exclude from Growth Chart --     Most recent vital signs: Vitals:   06/11/24 1459 06/11/24 1639  BP: (!) 142/104   Pulse: 97   Resp: 18   Temp: 98.7 F (37.1 C)   SpO2: 100% 100%    General Awake, no distress. Well-appearing. Anxious.  HEENT NCAT.  CV:  Good peripheral perfusion.  RESP:  Normal effort.  ABD:  No distention. Soft, non tender on palpation in all quadrants.    ED Results / Procedures / Treatments   Labs (all labs ordered are listed, but only abnormal results are displayed) Labs Reviewed   COMPREHENSIVE METABOLIC PANEL WITH GFR - Abnormal; Notable for the following components:      Result Value   AST 49 (*)    ALT 75 (*)    Total Bilirubin 1.6 (*)    All other components within normal limits  CBC - Abnormal; Notable for the following components:   WBC 11.2 (*)    Hemoglobin 15.6 (*)    HCT 46.6 (*)    All other components within normal limits  URINALYSIS, ROUTINE W REFLEX MICROSCOPIC - Abnormal; Notable for the following components:   Color, Urine YELLOW (*)    APPearance CLEAR (*)    All other components within normal limits  LIPASE, BLOOD  POC URINE PREG, ED   No results found.  PROCEDURES:  Critical Care performed: No  Procedures   MEDICATIONS ORDERED IN ED: Medications - No data to display   IMPRESSION / MDM / ASSESSMENT AND PLAN / ED COURSE  I reviewed the triage vital signs and the nursing notes.  Clinical Course as of 06/11/24 1741  Wed Jun 11, 2024  1735 WBC(!): 11.2 Mild leukocytosis [MH]  1735 Comprehensive metabolic panel(!) Elevated AST and ALT consistent with trend. Mildly elevated total bilirubin.  [MH]  1736 Urinalysis, Routine w reflex microscopic -Urine, Clean Catch(!) Normal [MH]  1736 POC urine preg, ED Negative [MH]    Clinical Course User Index [MH] Margrette, Lurie Mullane A, PA-C   28 y.o. female presents to the emergency department for evaluation and treatment of generalized abdominal pain x 2 weeks. See HPI for further details.   Differential diagnosis includes, but is not limited to constipation, gastroenteritis, diverticulosis, diverticulitis, UTI   Patient's presentation is most consistent with acute complicated illness / injury requiring diagnostic workup.  Patient is alert and oriented.  She is hemodynamic stable and afebrile.  Noted elevated BP of 142/104.  Well-appearing and quite anxious on initial assessment.  Physical exam findings are as stated above and benign abdominal exam.  Nontender on  palpation.  Lab work is reassuring.  Elevated liver enzymes is consistent with trend and suspect secondary to excessive alcohol intake. Chart reviewed. Shared visit with supervising physician Dr. Arlander who also assessed the patient.  No indication for further workup.  Will try patient with medications for nausea, antispasmodic and antibiotics.  Penicillin allergy will send Zithromax  and have her follow-up closely with her primary care provider.  Also advised follow-up with GI for further evaluation. The patient is in agreement with this care plan. She is stable for discharge home. ED precautions discussed.    FINAL CLINICAL IMPRESSION(S) / ED DIAGNOSES   Final diagnoses:  Generalized abdominal pain    Rx / DC Orders   ED Discharge Orders          Ordered    dicyclomine  (BENTYL ) 10 MG capsule  3 times daily before meals & bedtime        06/11/24 1707    ondansetron  (ZOFRAN -ODT) 4 MG disintegrating tablet  Every 8 hours PRN        06/11/24 1707    azithromycin  (ZITHROMAX  Z-PAK) 250 MG tablet  Status:  Discontinued        06/11/24 1707    azithromycin  (ZITHROMAX  Z-PAK) 250 MG tablet        06/11/24 1710            Note:  This document was prepared using Dragon voice recognition software and may include unintentional dictation errors.    Margrette, Nazario Russom A, PA-C 06/11/24 1741    Arlander [place], MD 06/11/24 [phone]

## 2024-06-11 NOTE — Discharge Instructions (Addendum)
 You were evaluated in the ED for generalized abdominal pain. Your workup is reassuring of any acute life-threatening injuries or illnesses. We encourage a close follow up with your primary care provide for further management.   Call and schedule an appointment with GI for further evaluation.

## 2024-06-11 NOTE — ED Triage Notes (Addendum)
 Pt arrives via POV with c/o abdominal pain that started 2 weeks ago and it's recently gotten worse. Pt states that it started more on the right side and moved the the left and is now both. Pt is A&Ox4 and ambulatory during triage.   Pt is taking zepbound  since earlier this year, no recent dosage changes.

## 2024-06-17 ENCOUNTER — Encounter: Payer: Self-pay | Admitting: Nurse Practitioner

## 2024-06-18 NOTE — Telephone Encounter (Signed)
 Mychart message sent requesting patient call office to schedule appointment.

## 2024-07-16 ENCOUNTER — Ambulatory Visit (INDEPENDENT_AMBULATORY_CARE_PROVIDER_SITE_OTHER): Admitting: Nurse Practitioner

## 2024-07-16 VITALS — BP 118/80 | HR 92 | Temp 98.1°F | Ht 61.0 in | Wt 180.8 lb

## 2024-07-16 DIAGNOSIS — N3090 Cystitis, unspecified without hematuria: Secondary | ICD-10-CM

## 2024-07-16 DIAGNOSIS — E669 Obesity, unspecified: Secondary | ICD-10-CM

## 2024-07-16 DIAGNOSIS — R350 Frequency of micturition: Secondary | ICD-10-CM

## 2024-07-16 LAB — POCT URINALYSIS DIPSTICK
Bilirubin, UA: POSITIVE
Blood, UA: POSITIVE
Glucose, UA: POSITIVE — AB
Ketones, UA: 4
Nitrite, UA: POSITIVE
Protein, UA: POSITIVE — AB
Spec Grav, UA: 1.015 (ref 1.010–1.025)
Urobilinogen, UA: 4 U/dL — AB
pH, UA: 5 (ref 5.0–8.0)

## 2024-07-16 MED ORDER — ZEPBOUND 12.5 MG/0.5ML ~~LOC~~ SOAJ: 12.5000 mg | SUBCUTANEOUS | 0 refills | Status: DC

## 2024-07-16 MED ORDER — NITROFURANTOIN MONOHYD MACRO 100 MG PO CAPS: 100.0000 mg | ORAL_CAPSULE | Freq: Two times a day (BID) | ORAL | 0 refills | Status: DC

## 2024-07-16 NOTE — Progress Notes (Signed)
 Established Patient Office Visit  Subjective   Patient ID: Leah Baxter, female    DOB: [birth year]  Age: 28 y.o. MRN: [id number]  Chief Complaint  Patient presents with   Urinary Tract Infection    Pt complains of frequent urination/pressure with pain that started on Monday. States she started AZO same day.     HPI  Discussed the use of AI scribe software for clinical note transcription with the patient, who gave verbal consent to proceed.  History of Present Illness Leah Baxter is a 28 year old female who presents with symptoms suggestive of a urinary tract infection (UTI).  Symptoms began two days ago following a weekend trip out of town for her anniversary. She spent time in a hot tub and possibly did not urinate as frequently as she should have, which she believes may have contributed to her current symptoms. She experiences urinary frequency, feeling the need to urinate every twenty minutes, but often with little to no urine output. No pain during urination. She has been taking Azo pills since Monday, which have helped numb the symptoms. She reports drinking plenty of water .  No fever, chills, lower abdominal pain, nausea, vomiting, or persistent back pain. She did experience a brief episode of pain in the lower left back on Monday, which lasted about ten minutes and has since resolved.  She is currently on Zepbound  injections and experiences nausea for the first day or two after administration. She is allergic to penicillins.  She recently got engaged during a trip to [place], [place] , and is planning to start a family in the next year or so.     Review of Systems  Constitutional:  Negative for chills and fever.  Respiratory:  Negative for shortness of breath.   Cardiovascular:  Negative for chest pain.  Gastrointestinal:  Positive for nausea. Negative for abdominal pain.  Genitourinary:  Positive for frequency and urgency. Negative for dysuria and hematuria.       Objective:     BP 118/80   Pulse 92   Temp 98.1 F (36.7 C) (Oral)   Ht 5' 1 (1.549 m)   Wt 180 lb 12.8 oz (82 kg)   SpO2 97%   BMI 34.16 kg/m  BP Readings from Last 3 Encounters:  07/16/24 118/80  06/11/24 (!) 142/104  05/07/24 112/76   Wt Readings from Last 3 Encounters:  07/16/24 180 lb 12.8 oz (82 kg)  06/11/24 180 lb (81.6 kg)  05/07/24 188 lb 12.8 oz (85.6 kg)   SpO2 Readings from Last 3 Encounters:  07/16/24 97%  06/11/24 100%  05/07/24 96%      Physical Exam Vitals and nursing note reviewed.  Constitutional:      Appearance: Normal appearance.  Cardiovascular:     Rate and Rhythm: Normal rate and regular rhythm.     Heart sounds: Normal heart sounds.  Pulmonary:     Effort: Pulmonary effort is normal.     Breath sounds: Normal breath sounds.  Abdominal:     General: Bowel sounds are normal.     Tenderness: There is no abdominal tenderness. There is no right CVA tenderness or left CVA tenderness.  Neurological:     Mental Status: She is alert.      Results for orders placed or performed in visit on 07/16/24  POCT urinalysis dipstick  Result Value Ref Range   Color, UA red    Clarity, UA clear    Glucose, UA Positive (A) Negative  Bilirubin, UA pos    Ketones, UA 4.0    Spec Grav, UA 1.015 1.010 - 1.025   Blood, UA pos    pH, UA 5.0 5.0 - 8.0   Protein, UA Positive (A) Negative   Urobilinogen, UA 4.0 (A) 0.2 or 1.0 E.U./dL   Nitrite, UA pos    Leukocytes, UA Large (3+) (A) Negative   Appearance     Odor        The ASCVD Risk score (Arnett DK, et al., 2019) failed to calculate for the following reasons:   The 2019 ASCVD risk score is only valid for ages 31 to 23    Assessment & Plan:   Problem List Items Addressed This Visit       Genitourinary   Cystitis   Relevant Medications   nitrofurantoin , macrocrystal-monohydrate, (MACROBID ) 100 MG capsule   Other Relevant Orders   Urine Culture     Other   Obesity (BMI  30-39.9)   Relevant Medications   tirzepatide  (ZEPBOUND ) 12.5 MG/0.5ML Pen   Other Visit Diagnoses       Urinary frequency    -  Primary   Relevant Orders   POCT urinalysis dipstick (Completed)     Assessment and Plan Assessment & Plan Urinary tract infection Acute UTI. Symptoms include urinary frequency without dysuria, no fever or significant pain. Empirical treatment started due to Azo interference with urinalysis. - Send urine culture for analysis. - Prescribe Macrobid  100 mg twice daily for 5 days. - Advise to discontinue Azo after today to monitor symptom improvement. - Encourage increased fluid intake. - Contact if culture indicates resistance or symptoms persist.  Obesity On Zepbound  15 mg weekly. Nausea with higher doses. Discussed reducing to 12.5 mg to manage side effects while maintaining weight management. -  reduce Zepbound  to 12.5 mg  - Use Zofran  as needed for nausea, aim to reduce Zepbound  dose. - Discuss dose adjustment at follow-up on August 07, 2024.   Return if symptoms worsen or fail to improve, for as scheduled .    Adina Crandall, NP

## 2024-07-17 LAB — URINE CULTURE
MICRO NUMBER:: 17102747
Result:: NO GROWTH
SPECIMEN QUALITY:: ADEQUATE

## 2024-07-18 ENCOUNTER — Ambulatory Visit: Payer: Self-pay | Admitting: Nurse Practitioner

## 2024-08-07 ENCOUNTER — Ambulatory Visit: Admitting: Nurse Practitioner

## 2024-08-07 VITALS — BP 118/80 | HR 72 | Temp 97.6°F | Ht 61.0 in | Wt 185.4 lb

## 2024-08-07 DIAGNOSIS — F411 Generalized anxiety disorder: Secondary | ICD-10-CM | POA: Diagnosis not present

## 2024-08-07 DIAGNOSIS — E669 Obesity, unspecified: Secondary | ICD-10-CM

## 2024-08-07 MED ORDER — LORAZEPAM 0.5 MG PO TABS: 0.5000 mg | ORAL_TABLET | Freq: Every day | ORAL | 0 refills | Status: AC | PRN

## 2024-08-07 NOTE — Progress Notes (Signed)
 Established Patient Office Visit  Subjective   Patient ID: Leah Baxter, female    DOB: 1996-02-08  Age: 28 y.o. MRN: [id number]  Chief Complaint  Patient presents with   Follow-up     Pt complains of doing well with mood.     HPI  Discussed the use of AI scribe software for clinical note transcription with the patient, who gave verbal consent to proceed.  History of Present Illness Leah Baxter is a 28 year old female who presents for follow-up on weight management and medication adjustment.  She was previously on Zepbound  15 mg weekly but experienced nausea, leading to a reduction to 12.5 mg weekly. She has improved tolerance with the lower dose, experiencing less nausea. However, she took a break from the medication for about one and a half to two weeks due to stress-related eating, resulting in some weight gain. She has resumed her medication and is back on track.  Her eating habits include consuming no more than three meals a day, with occasional snacking depending on stress levels. She often eats only half to three-quarters of her meals to avoid feeling unwell. She primarily drinks water , occasionally consuming soda to alleviate caffeine headaches. She is mindful of the caloric content of beverages.  She has increased her physical activity over the past month, using a weighted vest and a walking treadmill at home twice a week for 30 minutes. 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  Regarding her mental health, she previously experienced anxiety and was referred to psychiatry but has temporarily paused the referral. Her anxiety is currently manageable and she does not feel depressed. She uses lorazepam  sparingly for anxiety, with a few tablets remaining from a previous prescription. She also takes medication to aid sleep but experiences nightmares and difficulty staying  asleep. No thoughts of self-harm or hallucinations. Recent life events include her fianc's promotion, which has increased stress levels, and she is in the early stages of wedding planning. Despite the stress, she feels her relationship is stable.  She experiences occasional constipation with Zepbound  but generally has regular bowel movements almost daily, with occasional skips of one to two days.     Review of Systems  Constitutional:  Negative for chills and fever.  Respiratory:  Negative for shortness of breath.   Cardiovascular:  Negative for chest pain.  Gastrointestinal:  Negative for abdominal pain, constipation, nausea and vomiting.  Neurological:  Negative for headaches.  Psychiatric/Behavioral:  Negative for hallucinations and suicidal ideas. The patient has insomnia.       Objective:     BP 118/80   Pulse 72   Temp 97.6 F (36.4 C) (Oral)   Ht 5' 1 (1.549 m)   Wt 185 lb 6.4 oz (84.1 kg)   SpO2 95%   BMI 35.03 kg/m  BP Readings from Last 3 Encounters:  08/07/24 118/80  07/16/24 118/80  06/11/24 (!) 142/104   Wt Readings from Last 3 Encounters:  08/07/24 185 lb 6.4 oz (84.1 kg)  07/16/24 180 lb 12.8 oz (82 kg)  06/11/24 180 lb (81.6 kg)   SpO2 Readings from Last 3 Encounters:  08/07/24 95%  07/16/24 97%  06/11/24 100%      Physical Exam Vitals and nursing note reviewed.  Constitutional:      Appearance: Normal appearance.  Cardiovascular:     Rate and Rhythm: Normal rate and regular rhythm.  Heart sounds: Normal heart sounds.  Pulmonary:     Effort: Pulmonary effort is normal.     Breath sounds: Normal breath sounds.  Abdominal:     General: Bowel sounds are normal.  Neurological:     Mental Status: She is alert.      No results found for any visits on 08/07/24.    The ASCVD Risk score (Arnett DK, et al., 2019) failed to calculate for the following reasons:   The 2019 ASCVD risk score is only valid for ages 51 to 11    Assessment &  Plan:   Problem List Items Addressed This Visit       Other   GAD (generalized anxiety disorder) - Primary   Relevant Medications   LORazepam  (ATIVAN ) 0.5 MG tablet   Obesity (BMI 30-39.9)   Assessment and Plan Assessment & Plan Obesity Managed with Zepbound  12.5 mg weekly due to reduced nausea. Stress eating from personal stressors has led to weight gain. Exercise improved with weighted vest and treadmill. Diet includes mostly water , occasional soda, and mindful snacking. Exercise contributes 30-40% to weight loss, diet 60-70%. - Continue Zepbound  12.5 mg weekly. - Encouraged tracking dietary intake using apps like Lose It or My Fitness Pal. - Encouraged increasing exercise to 30 minutes a day, five times a week.  Anxiety Related to personal stressors, including work and wedding planning. No current depression. Lorazepam  used sparingly for acute episodes. Psychiatry referral paused but recommended for ongoing management. Anxiety contributes to insomnia and nightmares. - Encouraged scheduling a consultation with psychiatry for ongoing management. - Refilled lorazepam  prescription, valid for six months.  Insomnia Difficulty maintaining sleep and nightmares, likely related to anxiety. Current medication aids sleep onset but not maintenance. Anxiety and stress contribute to disturbances. - Encouraged addressing anxiety through psychiatry consultation to potentially improve sleep quality.  Return in about 3 months (around 11/07/2024) for CPE and Labs.    Adina Crandall, NP

## 2024-08-07 NOTE — Patient Instructions (Addendum)
 Nice to see you today When you use the 3rd or start the 4th dose of zepbound  let me know and I will refill it Follow up with me in 3 months for your physical , sooner if you need me

## 2024-08-12 ENCOUNTER — Other Ambulatory Visit: Payer: Self-pay | Admitting: Nurse Practitioner

## 2024-10-07 ENCOUNTER — Ambulatory Visit: Admitting: Family Medicine

## 2024-10-07 VITALS — BP 110/80 | HR 99 | Temp 97.7°F | Ht 61.0 in | Wt 177.5 lb

## 2024-10-07 DIAGNOSIS — H6991 Unspecified Eustachian tube disorder, right ear: Secondary | ICD-10-CM | POA: Diagnosis not present

## 2024-10-07 MED ORDER — PREDNISONE 20 MG PO TABS: ORAL_TABLET | ORAL | 0 refills | Status: DC

## 2024-10-07 NOTE — Assessment & Plan Note (Signed)
 Acute, decreased hearing caused by fluid behind right eardrum.  No associated infection noted. Given complete decrease in hearing she would prefer to proceed with prednisone  taper as opposed to nasal steroid.  Complete prednisone  taper over 7 days.  Start nasal saline irrigation.  Initially suggested decongestant but heart rate seems to be slightly elevated today so if this persists she we will hold off.  She does say she had a very stressful morning.

## 2024-10-07 NOTE — Progress Notes (Signed)
 "   Patient ID: Leah Baxter, female    DOB: Jul 17, 1996, 29 y.o.   MRN: [id number]  This visit was conducted in person.  BP 110/80   Pulse 99   Temp 97.7 F (36.5 C) (Temporal)   Ht 5' 1 (1.549 m)   Wt 177 lb 8 oz (80.5 kg)   LMP 09/25/2024   SpO2 97%   BMI 33.54 kg/m    CC:  Chief Complaint  Patient presents with   Ear Fullness    Right-Can't hear out of it    Subjective:   HPI: Leah Baxter is a 29 y.o. female presenting on 10/07/2024 for Ear Fullness (Right-Can't hear out of it)   New onset decrease hearing in  both ears, now better in left but more severe in right.  Associated with mild nasal congestion.  No ear pain, no fever. No cough, no SOB.  Occ ringing.   Has not been taking any medication.  Has been avoiding q tips.   History of cerumen impaction.        Relevant past medical, surgical, family and social history reviewed and updated as indicated. Interim medical history since our last visit reviewed. Allergies and medications reviewed and updated. Outpatient Medications Prior to Visit  Medication Sig Dispense Refill   LORazepam  (ATIVAN ) 0.5 MG tablet Take 1 tablet (0.5 mg total) by mouth daily as needed for anxiety. 15 tablet 0   ondansetron  (ZOFRAN -ODT) 4 MG disintegrating tablet Take 1 tablet (4 mg total) by mouth every 8 (eight) hours as needed. 20 tablet 0   tirzepatide  (ZEPBOUND ) 12.5 MG/0.5ML Pen INJECT 12.5 MG INTO THE SKIN ONCE A WEEK. 6 mL 0   No facility-administered medications prior to visit.     Per HPI unless specifically indicated in ROS section below Review of Systems  Constitutional:  Negative for fatigue and fever.  HENT:  Negative for congestion, ear discharge and ear pain.   Eyes:  Negative for pain.  Respiratory:  Negative for cough and shortness of breath.   Cardiovascular:  Negative for chest pain, palpitations and leg swelling.  Gastrointestinal:  Negative for abdominal pain.  Genitourinary:  Negative for dysuria and  vaginal bleeding.  Musculoskeletal:  Negative for back pain.  Neurological:  Negative for syncope, light-headedness and headaches.  Psychiatric/Behavioral:  Negative for dysphoric mood.    Objective:  BP 110/80   Pulse 99   Temp 97.7 F (36.5 C) (Temporal)   Ht 5' 1 (1.549 m)   Wt 177 lb 8 oz (80.5 kg)   LMP 09/25/2024   SpO2 97%   BMI 33.54 kg/m   Wt Readings from Last 3 Encounters:  10/07/24 177 lb 8 oz (80.5 kg)  08/07/24 185 lb 6.4 oz (84.1 kg)  07/16/24 180 lb 12.8 oz (82 kg)      Physical Exam Constitutional:      General: She is not in acute distress.    Appearance: Normal appearance. She is well-developed. She is not ill-appearing or toxic-appearing.  HENT:     Head: Normocephalic.     Right Ear: Hearing, ear canal and external ear normal. A middle ear effusion is present. There is no impacted cerumen. Tympanic membrane is not injected, erythematous, retracted or bulging.     Left Ear: Hearing, tympanic membrane, ear canal and external ear normal.  No middle ear effusion. There is no impacted cerumen. Tympanic membrane is not injected, erythematous, retracted or bulging.     Nose: No mucosal edema or rhinorrhea.  Right Sinus: No maxillary sinus tenderness or frontal sinus tenderness.     Left Sinus: No maxillary sinus tenderness or frontal sinus tenderness.     Mouth/Throat:     Pharynx: Uvula midline.  Eyes:     General: Lids are normal. Lids are everted, no foreign bodies appreciated.     Conjunctiva/sclera: Conjunctivae normal.     Pupils: Pupils are equal, round, and reactive to light.  Neck:     Thyroid : No thyroid  mass or thyromegaly.     Vascular: No carotid bruit.     Trachea: Trachea normal.  Cardiovascular:     Rate and Rhythm: Normal rate and regular rhythm.     Pulses: Normal pulses.     Heart sounds: Normal heart sounds, S1 normal and S2 normal. No murmur heard.    No friction rub. No gallop.  Pulmonary:     Effort: Pulmonary effort is normal.  No tachypnea or respiratory distress.     Breath sounds: Normal breath sounds. No decreased breath sounds, wheezing, rhonchi or rales.  Abdominal:     General: Bowel sounds are normal.     Palpations: Abdomen is soft.     Tenderness: There is no abdominal tenderness.  Musculoskeletal:     Cervical back: Normal range of motion and neck supple.  Skin:    General: Skin is warm and dry.     Findings: No rash.  Neurological:     Mental Status: She is alert.  Psychiatric:        Mood and Affect: Mood is not anxious or depressed.        Speech: Speech normal.        Behavior: Behavior normal. Behavior is cooperative.        Thought Content: Thought content normal.        Judgment: Judgment normal.       Results for orders placed or performed in visit on 07/16/24  POCT urinalysis dipstick   Collection Time: 07/16/24  9:49 AM  Result Value Ref Range   Color, UA red    Clarity, UA clear    Glucose, UA Positive (A) Negative   Bilirubin, UA pos    Ketones, UA 4.0    Spec Grav, UA 1.015 1.010 - 1.025   Blood, UA pos    pH, UA 5.0 5.0 - 8.0   Protein, UA Positive (A) Negative   Urobilinogen, UA 4.0 (A) 0.2 or 1.0 E.U./dL   Nitrite, UA pos    Leukocytes, UA Large (3+) (A) Negative   Appearance     Odor    Urine Culture   Collection Time: 07/16/24  9:52 AM   Specimen: Urine  Result Value Ref Range   MICRO NUMBER: [id number]    SPECIMEN QUALITY: Adequate    Sample Source URINE    STATUS: FINAL    Result: No Growth     Assessment and Plan  ETD (Eustachian tube dysfunction), right Assessment & Plan: Acute, decreased hearing caused by fluid behind right eardrum.  No associated infection noted. Given complete decrease in hearing she would prefer to proceed with prednisone  taper as opposed to nasal steroid.  Complete prednisone  taper over 7 days.  Start nasal saline irrigation.  Initially suggested decongestant but heart rate seems to be slightly elevated today so if this persists  she we will hold off.  She does say she had a very stressful morning.   Other orders -     predniSONE ; 3 tabs by mouth daily  x 3 days, then 2 tabs by mouth daily x 2 days then 1 tab by mouth daily x 2 days  Dispense: 15 tablet; Refill: 0    No follow-ups on file.   Greig Ring, MD  "

## 2024-11-07 ENCOUNTER — Encounter: Admitting: Nurse Practitioner

## 2024-11-07 ENCOUNTER — Encounter: Payer: Self-pay | Admitting: Nurse Practitioner

## 2024-11-07 VITALS — BP 132/88 | HR 88 | Temp 97.8°F | Ht 62.5 in | Wt 181.8 lb

## 2024-11-07 DIAGNOSIS — Z13220 Encounter for screening for lipoid disorders: Secondary | ICD-10-CM

## 2024-11-07 DIAGNOSIS — Z0000 Encounter for general adult medical examination without abnormal findings: Secondary | ICD-10-CM

## 2024-11-07 DIAGNOSIS — Z131 Encounter for screening for diabetes mellitus: Secondary | ICD-10-CM

## 2024-11-07 DIAGNOSIS — Z1159 Encounter for screening for other viral diseases: Secondary | ICD-10-CM

## 2024-11-07 DIAGNOSIS — E669 Obesity, unspecified: Secondary | ICD-10-CM

## 2024-11-07 DIAGNOSIS — F331 Major depressive disorder, recurrent, moderate: Secondary | ICD-10-CM

## 2024-11-07 DIAGNOSIS — R41840 Attention and concentration deficit: Secondary | ICD-10-CM | POA: Insufficient documentation

## 2024-11-07 MED ORDER — WEGOVY 1.5 MG PO TABS: 1.5000 mg | ORAL_TABLET | Freq: Every day | ORAL | 0 refills | Status: AC

## 2024-11-07 NOTE — Assessment & Plan Note (Signed)
 Patient wonders if she is having some attention deficit disorder.  States she did try a low-dose Adderall that seemed to help.  Referral to psychiatry for further evaluation given patient's other comorbidities

## 2024-11-07 NOTE — Assessment & Plan Note (Signed)
 Discussed age-appropriate immunizations and screening exams.  Did review patient's personal, surgical, social, family history.  Patient is up-to-date with all age-appropriate vaccinations he would like.  Patient declined tetanus and flu vaccine.  Patient thinks up-to-date with HPV vaccines.  Patient is too young for colorectal cancer screening.  Too young for breast cancer screening.  Patient's Pap smear is up-to-date through GYN.  Patient was given information at discharge about preventative healthcare maintenance with anticipatory guidance.

## 2024-11-07 NOTE — Progress Notes (Signed)
 "  Established Patient Office Visit  Subjective   Patient ID: Leah Baxter, female    DOB: July 15, 1996  Age: 29 y.o. MRN: [id number]  Chief Complaint  Patient presents with   Annual Exam    Pt complains of stopping all meds cold turkey in December. States that zepbound  made her nauseous     HPI  for complete physical and follow up of chronic conditions.  Obesity: she was on zepound. States that she stopped due to nausea and some constipation. She is interested in starting back but would like to not do an injectable version    Concentration: states that she feels like she is ADHD. Statse that she has diffculty  concerntraiont at work and at home. States that she did try adderall and it helped her a lot. States that she will not concentration and then get into an anxiety . States that she did try wellbutrin  and did not do well.  Immunizations: -Tetanus: Completed in declined  -Influenza: declined  -Shingles: too young -Pneumonia: too young  -HPV: thinks she is up to date  Diet: Fair diet. She is eating 2-3 meals a day. She is snacking. She is doing water  and does diet sodas.  Exercise:  She is walking  once a week currently and she is fixing to do pilates   Eye exam: PRN  Dental exam: Completes semi-annually    Colonoscopy: Completed in 2023, currently too young for screening  Lung Cancer Screening: NA   Pap Smear: 2024 in [place]  Mammogram: too young  Sleep: going to bed at irregular sleep pattern. Statse that she just got a otc supplement. States that she is not staying to sleep. States that she will take an anxiety pill and that will help. She has trouble shutting her mind off       Review of Systems  Constitutional:  Negative for chills and fever.  Respiratory:  Negative for shortness of breath.   Cardiovascular:  Negative for chest pain and leg swelling.  Gastrointestinal:  Negative for abdominal pain, blood in stool, constipation, diarrhea, nausea and vomiting.        BM daily   Genitourinary:  Negative for dysuria and hematuria.  Neurological:  Negative for dizziness, tingling and headaches.  Psychiatric/Behavioral:  Negative for hallucinations and suicidal ideas.       Objective:     BP 132/88   Pulse 88   Temp 97.8 F (36.6 C) (Oral)   Ht 5' 2.5 (1.588 m)   Wt 181 lb 12.8 oz (82.5 kg)   LMP 10/26/2024 (Approximate)   SpO2 99%   BMI 32.72 kg/m  BP Readings from Last 3 Encounters:  11/07/24 132/88  10/07/24 110/80  08/07/24 118/80   Wt Readings from Last 3 Encounters:  11/07/24 181 lb 12.8 oz (82.5 kg)  10/07/24 177 lb 8 oz (80.5 kg)  08/07/24 185 lb 6.4 oz (84.1 kg)   SpO2 Readings from Last 3 Encounters:  11/07/24 99%  10/07/24 97%  08/07/24 95%      Physical Exam Vitals and nursing note reviewed.  Constitutional:      Appearance: Normal appearance.  HENT:     Right Ear: Tympanic membrane, ear canal and external ear normal.     Left Ear: Tympanic membrane, ear canal and external ear normal.     Mouth/Throat:     Mouth: Mucous membranes are moist.     Pharynx: Oropharynx is clear.  Eyes:     Extraocular Movements: Extraocular movements intact.  Pupils: Pupils are equal, round, and reactive to light.  Cardiovascular:     Rate and Rhythm: Normal rate and regular rhythm.     Pulses: Normal pulses.     Heart sounds: Normal heart sounds.  Pulmonary:     Effort: Pulmonary effort is normal.     Breath sounds: Normal breath sounds.  Abdominal:     General: Bowel sounds are normal. There is no distension.     Palpations: There is no mass.     Tenderness: There is no abdominal tenderness.     Hernia: No hernia is present.  Musculoskeletal:     Right lower leg: No edema.     Left lower leg: No edema.  Lymphadenopathy:     Cervical: No cervical adenopathy.  Skin:    General: Skin is warm.  Neurological:     General: No focal deficit present.     Mental Status: She is alert.     Deep Tendon Reflexes:      Reflex Scores:      Bicep reflexes are 2+ on the right side and 2+ on the left side.      Patellar reflexes are 2+ on the right side and 2+ on the left side.    Comments: Bilateral upper and lower extremity strength 5/5  Psychiatric:        Mood and Affect: Mood normal.        Behavior: Behavior normal.        Thought Content: Thought content normal.        Judgment: Judgment normal.      No results found for any visits on 11/07/24.    The ASCVD Risk score (Arnett DK, et al., 2019) failed to calculate for the following reasons:   The 2019 ASCVD risk score is only valid for ages 74 to 66    Assessment & Plan:   Problem List Items Addressed This Visit       Other   Preventative health care - Primary   Discussed age-appropriate immunizations and screening exams.  Did review patient's personal, surgical, social, family history.  Patient is up-to-date with all age-appropriate vaccinations he would like.  Patient declined tetanus and flu vaccine.  Patient thinks up-to-date with HPV vaccines.  Patient is too young for colorectal cancer screening.  Too young for breast cancer screening.  Patient's Pap smear is up-to-date through GYN.  Patient was given information at discharge about preventative healthcare maintenance with anticipatory guidance.      Relevant Orders   CBC with Differential/Platelet   Comprehensive metabolic panel with GFR   TSH   Current moderate episode of major depressive disorder (HCC)   History of the same.  Patient was referred to psychiatry but never made the appointment.  Information given to patient to set up an appointment with psychiatry.  Patient denies HI/SI/AVH      Obesity (BMI 30-39.9)   History of same.  Patient was on Zepbound  in the past.  Did well until reaching the higher dosages.  Patient stopped on her own.  Patient interested in oral version of the medication we will try oral Wegovy .  Did discuss this is not safe during pregnancy.  Patient  understood also recommended a 75-month washout prior to trying to become pregnant.  Patient acknowledged.        Relevant Medications   semaglutide -weight management (WEGOVY ) 1.5 MG tablet   Concentration deficit   Patient wonders if she is having some attention deficit disorder.  States she did try a low-dose Adderall that seemed to help.  Referral to psychiatry for further evaluation given patient's other comorbidities      Other Visit Diagnoses       Encounter for hepatitis C screening test for low risk patient       Relevant Orders   Hepatitis C antibody     Screening for diabetes mellitus       Relevant Orders   Hemoglobin A1c     Screening for lipid disorders       Relevant Orders   Lipid panel       Return in about 3 months (around 02/04/2025) for weight/wegovy .    Adina Crandall, NP  "

## 2024-11-07 NOTE — Assessment & Plan Note (Signed)
 History of the same.  Patient was referred to psychiatry but never made the appointment.  Information given to patient to set up an appointment with psychiatry.  Patient denies HI/SI/AVH

## 2024-11-07 NOTE — Patient Instructions (Signed)
 Nice to see you today  I will be in touch with the labs once I have them  Follow up with me in 3 months sooner if you need me  Call and schedule at  [facility] [address] [place] [address] [phone]

## 2024-11-07 NOTE — Assessment & Plan Note (Signed)
 History of same.  Patient was on Zepbound  in the past.  Did well until reaching the higher dosages.  Patient stopped on her own.  Patient interested in oral version of the medication we will try oral Wegovy .  Did discuss this is not safe during pregnancy.  Patient understood also recommended a 72-month washout prior to trying to become pregnant.  Patient acknowledged.

## 2025-02-06 ENCOUNTER — Ambulatory Visit: Admitting: Nurse Practitioner
